# Patient Record
Sex: Female | Born: 1935 | Race: White | Hispanic: No | Smoking: Former smoker
Health system: Southern US, Community
[De-identification: ages and names within clinical notes are randomized; demographics above are authoritative.]

## PROBLEM LIST (undated history)

## (undated) DIAGNOSIS — Z48817 Encounter for surgical aftercare following surgery on the skin and subcutaneous tissue: Secondary | ICD-10-CM

## (undated) DIAGNOSIS — Z1212 Encounter for screening for malignant neoplasm of rectum: Secondary | ICD-10-CM

## (undated) DIAGNOSIS — M899 Disorder of bone, unspecified: Secondary | ICD-10-CM

## (undated) DIAGNOSIS — C159 Malignant neoplasm of esophagus, unspecified: Secondary | ICD-10-CM

## (undated) DIAGNOSIS — M949 Disorder of cartilage, unspecified: Secondary | ICD-10-CM

## (undated) DIAGNOSIS — E079 Disorder of thyroid, unspecified: Secondary | ICD-10-CM

## (undated) DIAGNOSIS — I10 Essential (primary) hypertension: Secondary | ICD-10-CM

## (undated) DIAGNOSIS — L570 Actinic keratosis: Secondary | ICD-10-CM

## (undated) DIAGNOSIS — C44319 Basal cell carcinoma of skin of other parts of face: Secondary | ICD-10-CM

## (undated) DIAGNOSIS — D485 Neoplasm of uncertain behavior of skin: Secondary | ICD-10-CM

## (undated) HISTORY — DX: Encounter for surgical aftercare following surgery on the skin and subcutaneous tissue: Z48.817

## (undated) HISTORY — DX: Disorder of thyroid, unspecified: E07.9

## (undated) HISTORY — PX: UPPER GASTROINTESTINAL ENDOSCOPY: SHX188

## (undated) HISTORY — DX: Neoplasm of uncertain behavior of skin: D48.5

## (undated) HISTORY — DX: Disorder of bone, unspecified: M89.9

## (undated) HISTORY — DX: Disorder of cartilage, unspecified: M94.9

## (undated) HISTORY — DX: Encounter for screening for malignant neoplasm of rectum: Z12.12

## (undated) HISTORY — DX: Basal cell carcinoma of skin of other parts of face: C44.319

## (undated) HISTORY — DX: Actinic keratosis: L57.0

---
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
## 1998-02-07 HISTORY — PX: OTHER SURGICAL HISTORY: SHX169

## 1998-02-07 LAB — HM COLONOSCOPY

## 1999-02-06 DIAGNOSIS — K9171 Accidental puncture and laceration of a digestive system organ or structure during a digestive system procedure: Secondary | ICD-10-CM | POA: Insufficient documentation

## 1999-02-06 DIAGNOSIS — K631 Perforation of intestine (nontraumatic): Secondary | ICD-10-CM | POA: Insufficient documentation

## 2000-01-25 ENCOUNTER — Inpatient Hospital Stay (HOSPITAL_COMMUNITY): Admission: EM | Admit: 2000-01-25 | Discharge: 2000-01-30 | Payer: Self-pay | Admitting: Internal Medicine

## 2000-01-25 ENCOUNTER — Encounter: Payer: Self-pay | Admitting: Internal Medicine

## 2001-02-07 LAB — HM PAP SMEAR

## 2003-02-08 HISTORY — PX: BACK SURGERY: SHX140

## 2007-02-08 LAB — HM MAMMOGRAPHY: HM MAMMO: NORMAL (ref 0–4)

## 2007-10-18 ENCOUNTER — Encounter: Payer: Self-pay | Admitting: Infectious Diseases

## 2007-10-24 ENCOUNTER — Ambulatory Visit: Payer: Self-pay | Admitting: Infectious Diseases

## 2007-10-24 DIAGNOSIS — R1310 Dysphagia, unspecified: Secondary | ICD-10-CM | POA: Insufficient documentation

## 2007-10-24 DIAGNOSIS — R509 Fever, unspecified: Secondary | ICD-10-CM

## 2007-10-24 LAB — CONVERTED CEMR LAB
ALT: 25 units/L (ref 0–35)
AST: 26 units/L (ref 0–37)
Albumin: 4.5 g/dL (ref 3.5–5.2)
Alkaline Phosphatase: 132 units/L — ABNORMAL HIGH (ref 39–117)
BUN: 20 mg/dL (ref 6–23)
Basophils Absolute: 0 10*3/uL (ref 0.0–0.1)
Basophils Relative: 0 % (ref 0–1)
CO2: 22 meq/L (ref 19–32)
CRP: 2.9 mg/dL — ABNORMAL HIGH (ref <0.6)
Calcium: 10.7 mg/dL — ABNORMAL HIGH (ref 8.4–10.5)
Chloride: 99 meq/L (ref 96–112)
Creatinine, Ser: 0.71 mg/dL (ref 0.40–1.20)
Eosinophils Absolute: 0.3 10*3/uL (ref 0.0–0.7)
Eosinophils Relative: 3 % (ref 0–5)
Ferritin: 316 ng/mL — ABNORMAL HIGH (ref 10–291)
Glucose, Bld: 99 mg/dL (ref 70–99)
HCT: 46.6 % — ABNORMAL HIGH (ref 36.0–46.0)
Hemoglobin: 15 g/dL (ref 12.0–15.0)
Lymphocytes Relative: 23 % (ref 12–46)
Lymphs Abs: 2.7 10*3/uL (ref 0.7–4.0)
MCHC: 32.2 g/dL (ref 30.0–36.0)
MCV: 87.1 fL (ref 78.0–100.0)
Monocytes Absolute: 1.3 10*3/uL — ABNORMAL HIGH (ref 0.1–1.0)
Monocytes Relative: 11 % (ref 3–12)
Neutro Abs: 7.6 10*3/uL (ref 1.7–7.7)
Neutrophils Relative %: 64 % (ref 43–77)
Platelets: 466 10*3/uL — ABNORMAL HIGH (ref 150–400)
Potassium: 4.3 meq/L (ref 3.5–5.3)
RBC: 5.35 M/uL — ABNORMAL HIGH (ref 3.87–5.11)
RDW: 14.1 % (ref 11.5–15.5)
Sed Rate: 43 mm/hr — ABNORMAL HIGH (ref 0–22)
Sodium: 138 meq/L (ref 135–145)
Total Bilirubin: 0.5 mg/dL (ref 0.3–1.2)
Total Protein: 7.9 g/dL (ref 6.0–8.3)
WBC: 11.9 10*3/uL — ABNORMAL HIGH (ref 4.0–10.5)

## 2007-10-30 ENCOUNTER — Ambulatory Visit: Payer: Self-pay | Admitting: Infectious Diseases

## 2007-10-30 ENCOUNTER — Encounter: Payer: Self-pay | Admitting: Infectious Diseases

## 2007-10-30 ENCOUNTER — Ambulatory Visit (HOSPITAL_COMMUNITY): Admission: RE | Admit: 2007-10-30 | Discharge: 2007-10-30 | Payer: Self-pay | Admitting: Infectious Diseases

## 2007-10-30 LAB — CONVERTED CEMR LAB
Bilirubin Urine: NEGATIVE
Hemoglobin, Urine: NEGATIVE
Ketones, ur: NEGATIVE mg/dL
Nitrite: NEGATIVE
Protein, ur: NEGATIVE mg/dL
RBC / HPF: NONE SEEN (ref <3)
Specific Gravity, Urine: 1.01 (ref 1.005–1.03)
Urine Glucose: NEGATIVE mg/dL
Urobilinogen, UA: 0.2 (ref 0.0–1.0)
pH: 7 (ref 5.0–8.0)

## 2007-10-31 ENCOUNTER — Telehealth: Payer: Self-pay | Admitting: Infectious Diseases

## 2007-11-01 ENCOUNTER — Telehealth: Payer: Self-pay | Admitting: Internal Medicine

## 2007-11-05 ENCOUNTER — Ambulatory Visit: Payer: Self-pay | Admitting: Infectious Diseases

## 2007-11-05 LAB — CONVERTED CEMR LAB
Basophils Absolute: 0 10*3/uL (ref 0.0–0.1)
Basophils Relative: 0 % (ref 0–1)
CRP: 1.4 mg/dL — ABNORMAL HIGH (ref <0.6)
Eosinophils Absolute: 0.2 10*3/uL (ref 0.0–0.7)
Eosinophils Relative: 2 % (ref 0–5)
HCT: 44.5 % (ref 36.0–46.0)
Hemoglobin: 14.3 g/dL (ref 12.0–15.0)
Lymphocytes Relative: 41 % (ref 12–46)
Lymphs Abs: 2.8 10*3/uL (ref 0.7–4.0)
MCHC: 32.1 g/dL (ref 30.0–36.0)
MCV: 86.7 fL (ref 78.0–100.0)
Monocytes Absolute: 0.5 10*3/uL (ref 0.1–1.0)
Monocytes Relative: 8 % (ref 3–12)
Neutro Abs: 3.3 10*3/uL (ref 1.7–7.7)
Neutrophils Relative %: 49 % (ref 43–77)
Platelets: 359 10*3/uL (ref 150–400)
RBC: 5.13 M/uL — ABNORMAL HIGH (ref 3.87–5.11)
RDW: 14.4 % (ref 11.5–15.5)
Sed Rate: 21 mm/hr (ref 0–22)
WBC: 6.7 10*3/uL (ref 4.0–10.5)

## 2007-11-06 ENCOUNTER — Ambulatory Visit: Payer: Self-pay | Admitting: Gastroenterology

## 2007-11-06 DIAGNOSIS — R1319 Other dysphagia: Secondary | ICD-10-CM

## 2007-11-06 DIAGNOSIS — R630 Anorexia: Secondary | ICD-10-CM

## 2007-11-07 ENCOUNTER — Encounter: Payer: Self-pay | Admitting: Infectious Diseases

## 2007-11-07 ENCOUNTER — Encounter: Payer: Self-pay | Admitting: Internal Medicine

## 2007-11-07 ENCOUNTER — Ambulatory Visit: Payer: Self-pay | Admitting: Internal Medicine

## 2007-11-08 ENCOUNTER — Encounter: Payer: Self-pay | Admitting: Internal Medicine

## 2007-11-12 ENCOUNTER — Telehealth: Payer: Self-pay | Admitting: Internal Medicine

## 2009-11-24 HISTORY — PX: OTHER SURGICAL HISTORY: SHX169

## 2010-06-25 NOTE — Op Note (Signed)
[facility]  Patient:    [name], Cheryl                  MRN: [id number] Proc. Date: 01/25/00 Adm. Date:  [id number] Attending:  Henrene Dodge CC:         Hedwig Morton. Juanda Chance, M.D. [facility]   Operative Report  PREOPERATIVE DIAGNOSIS:  Perforated colon during colonoscopy.  POSTOPERATIVE DIAGNOSIS:  Perforated colon distal sigmoid and minimal sigmoid diverticulosis.  OPERATION:  Exploratory laparotomy and closure of perforated colon and interoperative flexible sigmoidoscopy.  ANESTHESIA:  General.  SURGEON:  Anselm Pancoast. Zachery Dakins, M.D.  HISTORY OF PRESENT ILLNESS:  Cheryl Holmes is a 77 year old Caucasian female who was undergoing a colonoscopy today by Dr. Lina Sar for symptoms of basically change in bowel habits and feeling that she was not completely evacuating.  She had not had any blood in her stool and is not anemic at the time of the colonoscopy.   Dr. Juanda Chance said she just at approximately 20 cm when obviously the patient had a perforation with pain, etc., and I was called promptly.  The OR was notified and was fortunately available in a short time, and she was given 3 grams of Unasyn and permission obtained for a laparotomy for closure of the perforated colon.  I discussed with the patient that it might be necessary for a temporary colostomy, and that we would check to see if any evidence of any obvious lesion because of these changes in bowel habits.  The patient was taken over to surgery.  The 3 grams of Unasyn had been given, induction of general anesthesia, and a Foley catheter was placed sterilely, and then the abdomen was prepped with Betadine surgical scrubbing solution and draped in a sterile manner.  A midline incision fromthe umbilicus to the subpubic area was made, down through the skin and subcutaneous anterior fascia and then carefully opened to the peritoneum. There was a moderate amount of free air but fortunately  not much stool contamination.  In the distal sigmoid, there was a few adhesions that looked like maybe minimal diverticulosis around it, and these were taken down, and then probably about 3 inches above the peritoneal reflection or maybe 2, you could see a perforation anterior, and the edges of this were carefully identified, and then I closed it transversely with interrupted sutures of 3-0 silk.  Next, on examination of the colon, I could feel no lesions in the sigmoid, splenic flexure, transverse colon, or hepatic flexure, but, in the area right above the area of the sigmoid colon, I could feel this thickening in the mesentery side that really felt more like diverticulosis.  Since she had had a change in bowel movements, it was then evaluated because of a partial obstruction type minimal symptoms, I elected to talk with Dr. Juanda Chance and suggested that we do an intraoperative flexible sigmoidoscopy to make sure that flexible sigmoidoscopy to make sure that were not missing a little lesion at this area.  Dr. Yancey Flemings was available and came in, and we placed her up in frogleg position and then colonoscoped her and did a flexible sigmoidoscopy to this area.  He could see the repaired area and then about 3-4 cm past that, could see a diverticulum.  There was definitely of any mucosal lesions like a tumor.  We put the scope on up about 6 inches above that, then came back through the area again, and there was no colonic mucosal lesion.  With  this, I then completed the procedure.  We had evacuated the gas out of the colon.  I hadpinched it off proximally so that she would not be real distended, and then the small bowel was placed in normal anatomical position and the omentum brought over it, and then the midline was closed with a running #1 PDS, and the skin was closed with staples.  The patient tolerated the procedure nicely and will be receiving Unasyn for approximately 48 hours and then PCA  morphine for pain control.  I am going to attempt to do her without doing an NG tube. If she becomes nauseous or distended, we will have to place one, but hopefully this will not be necessary with the limits of surgery that was needed. Sponge, needle and instrument counts were correct.Estimated blood loss was minimal.  Dr. Marina Goodell will dictate his portion of the flexible sigmoidoscopy. DD:  01/25/16 TD:  01/27/00 Job: [id number]

## 2010-06-25 NOTE — H&P (Signed)
[facility]  Patient:    Cheryl Holmes, Cheryl Holmes                 MRN: [id number] Adm. Date:  01/25/00 Attending:  Hedwig Morton. Juanda Chance, M.D. [facility] Dictator:   Mike Gip, P.A.-C CC:         Dr. Dan Humphreys in [facility]. Zachery Dakins, M.D.   History and Physical  PROBLEM:  Pneumoperitoneum.  HISTORY OF PRESENT ILLNESS:  Cheryl Holmes is a pleasant, generally healthy, 77 year old white female primary patient of Dr. Dan Humphreys in [place] who was evaluated in our office on December 17 per Hedwig Morton. Juanda Chance, M.D. with complaints of change in her bowel habits. The patient has noted constipation onset in September of 2001. Apparently, she has been able to have a bowel movement but has had increased effort with bowel movements over the past couple of months and has noticed a change in her stool caliber with more narrow stools, as well. She had no complaints of abdominal pain or rectal bleeding. No nausea or vomiting. Appetite had been fine and weight had been stable. The patient was scheduled for outpatient colonoscopy today and underwent colonoscopy with Hedwig Morton. Juanda Chance, M.D. Shortly after insertion of the colonoscope into the rectosigmoid region at approximately 20 cm at the level of the first "turn", a pop was heard consistent with a perforation. The scope was retracted. Exam was not completed, and abdominal films were obtained. She did appear to have an excellent prep. Abdominal films are consistent with pneumoperitoneum. At this time, the patient is admitted to the hospital for surgical consultation and exploratory laparotomy. At the time of admission, the patient is complaining of a dull ache in her lower abdomen but no significant pain. She is hemodynamically stable.  CURRENT MEDICATIONS: 1. Prempro 1 p.o. q.d. 2. Synthroid 0.05 p.o. q.d. 3. Multivitamin daily. 4. Potassium on a p.r.n. basis.  ALLERGIES:  SULFA which causes rash and hives.  PAST  MEDICAL HISTORY:  Pertinent for hypothyroidism, remote appendectomy, a question of emphysema, and has been told that she has a sinus arrhythmia by her primary physician.  FAMILY HISTORY:  Negative for colon cancer or polyps. The patients father was deceased at 31 secondary to an MI. Mother deceased at 37 secondary to "old age."  SOCIAL HISTORY:  The patient is married. She is retired. She lives in [place]. Is very active. Plays tennis on a regular basis. She has four children. She is a nonsmoker. Drinks alcohol socially.  REVIEW OF SYSTEMS:  CARDIOVASCULAR:  The patient has no complaints of chest pain or anginal symptoms. PULMONARY:  The patient has had some dyspnea long-term and wheezing with exposure to dust, etc. Apparently, her tolerance level is very good, however. She states she can play tennis and walk 4 miles without difficulty. GI:  As above. GENITOURINARY:  Denies any dysuria, urgency, or frequency. MUSCULOSKELETAL:  Pertinent for history of sciatic pain in the past and no current symptoms.  PHYSICAL EXAMINATION:  Per Hedwig Morton. Juanda Chance, M.D.  GENERAL:  The patient is a well-developed, thin, white female in no acute distress. She is alert post procedure and post sedation.  VITAL SIGNS:  Blood pressure 120/70, pulse is 80 and irregular rate, respirations 16, weight is 115.  SKIN:  Warm and dry.  HEENT:  Normocephalic, atraumatic. EOMI. PERRLA. Sclerae anicteric.  NECK:  Supple. There is no JVD or bruits.  CARDIOVASCULAR:  Regular rate and rhythm with S1 and S2.  PULMONARY:  Clear to A&P.  ABDOMEN:  Soft. Mildly tympanitic. She is nontender. Bowel sounds are present but hypoactive. There is no guarding or rebound. No palpable mass or hepatosplenomegaly.  RECTAL:  Stool heme negative. See colonoscopy report.  EXTREMITIES:  Without clubbing, cyanosis, or edema. Pulses are intact.  LABORATORY DATA:  Abdominal films per Dr. ______ consistent  with pneumoperitoneum.  Labs are pending at the time of this dictation.  IMPRESSION: 1. The patient is a 77 year old white female with a pneumoperitoneum secondary    to instrumentation of the rectosigmoid colon. 2. Rule out obstructing lesion in the rectosigmoid with recent complaint of    constipation and change in stool caliber. 3. Hypothyroidism. 4. Sinus arrhythmia. 4. Status post remote appendectomy. 5. Chronic obstructive pulmonary disease by history.  PLAN:  The patient is admitted to the hospital. She will be kept n.p.o. We have obtained surgical consultation per Anselm Pancoast. Weatherly, M.D. and anticipate that she will go to the OR this afternoon. She has been given a dose of IV Cipro and will be continued on IV Unasyn. Admission labs obtained. For further details, please see the orders. DD:  01/25/00 TD:  01/25/00 Job: [id number]

## 2010-06-25 NOTE — Discharge Summary (Signed)
[facility]  Patient:    Cheryl Holmes, Cheryl Holmes                  MRN: [id number] Adm. Date:  [id number] Disc. Date: [id number] Attending:  Henrene Dodge CC:         Hedwig Morton. Juanda Chance, M.D. [facility]   Discharge Summary  DISCHARGE DIAGNOSES: 1. Perforation of colon, sigmoid, with colonoscopy. 2. History of diverticulosis.  OPERATION: 1. Laparotomy and repair of laceration perforation sigmoid colon, attempt at    full colonoscopy by Dr. Lina Sar. 2. Flexible sigmoidoscopy by Dr. Yancey Flemings.  HISTORY OF PRESENT ILLNESS:  Cheryl Holmes is a 77 year old Caucasian female who was undergoing a colonoscopy for constipation, sort of a change in bowel habits by Dr. Lina Sar on January 25, 2000, when the patient experienced discomfort.  Dr. Juanda Chance recognized that a perforation of the sigmoid colon right above the peritoneal flexure had occurred.  She stopped the colonoscopy, and general surgery consultation was requested.  I saw her, and took the patient directly to OR shortly afterwards.  She had a small laceration about 3 cm, really right at the top of the rectum, distal sigmoid, that was repaired with suture closure, and I then had a flexible sigmoidoscopy performed by Dr. Yancey Flemings to make sure that the little area that I was feeling was just diverticulosis.  There was no evidence of any obstruction of the sigmoid.  There was minimal diverticulosis.  Postoperatively, she did nicely.  We did not place a nasogastric tube, but kept her on broad antibiotic coverage.  She started passing a little flatus on the third postoperative day, and then started having bowel function shortly afterwards.  The incision healed without evidence of infection.  She required very little pain medication postoperatively, and was ready for discharge in an improved condition on January 30, 2000.  Her incisions are healing satisfactorily, and I will see her in the office for  removal of the staples in 3 to 4 days.  She denied the need of pain medications at the time of discharge.  The patient is not on any type of chronic medications, but will use laxatives if needed for regulation of her bowels which was the presenting symptom that caused her to undergo the colonoscopy. DD:  02/17/00 TD:  02/17/00 Job: [id number]

## 2015-04-27 DIAGNOSIS — Z719 Counseling, unspecified: Secondary | ICD-10-CM | POA: Diagnosis not present

## 2015-04-27 DIAGNOSIS — J019 Acute sinusitis, unspecified: Secondary | ICD-10-CM | POA: Diagnosis not present

## 2015-04-27 DIAGNOSIS — Z681 Body mass index (BMI) 19 or less, adult: Secondary | ICD-10-CM | POA: Diagnosis not present

## 2015-06-08 DIAGNOSIS — E039 Hypothyroidism, unspecified: Secondary | ICD-10-CM | POA: Diagnosis not present

## 2015-06-08 DIAGNOSIS — R5383 Other fatigue: Secondary | ICD-10-CM | POA: Diagnosis not present

## 2015-06-08 DIAGNOSIS — E875 Hyperkalemia: Secondary | ICD-10-CM | POA: Diagnosis not present

## 2015-06-08 DIAGNOSIS — M899 Disorder of bone, unspecified: Secondary | ICD-10-CM | POA: Diagnosis not present

## 2015-06-08 LAB — LIPID PANEL
CHOLESTEROL: 208 mg/dL — AB (ref 0–200)
HDL: 73 mg/dL — AB (ref 35–70)
LDL Cholesterol: 120 mg/dL
TRIGLYCERIDES: 73 mg/dL (ref 40–160)

## 2015-06-08 LAB — BASIC METABOLIC PANEL
BUN: 19 mg/dL (ref 4–21)
CREATININE: 0.9 mg/dL (ref 0.5–1.1)
Glucose: 91 mg/dL
POTASSIUM: 5.1 mmol/L (ref 3.4–5.3)
SODIUM: 139 mmol/L (ref 137–147)

## 2015-06-08 LAB — HEPATIC FUNCTION PANEL
ALK PHOS: 106 U/L (ref 25–125)
ALT: 28 U/L (ref 7–35)
AST: 26 U/L (ref 13–35)
BILIRUBIN, TOTAL: 0.3 mg/dL

## 2015-06-08 LAB — TSH: TSH: 1.97 u[IU]/mL (ref 0.41–5.90)

## 2015-06-08 LAB — VITAMIN D 25 HYDROXY (VIT D DEFICIENCY, FRACTURES): Vit D, 25-Hydroxy: 47.8

## 2015-06-08 LAB — CBC AND DIFFERENTIAL
HCT: 47 % — AB (ref 36–46)
HEMOGLOBIN: 15.2 g/dL (ref 12.0–16.0)
Platelets: 279 10*3/uL (ref 150–399)
WBC: 7.1 10*3/mL

## 2015-06-11 DIAGNOSIS — Z719 Counseling, unspecified: Secondary | ICD-10-CM | POA: Diagnosis not present

## 2015-06-11 DIAGNOSIS — Z0000 Encounter for general adult medical examination without abnormal findings: Secondary | ICD-10-CM | POA: Diagnosis not present

## 2015-06-11 DIAGNOSIS — Z681 Body mass index (BMI) 19 or less, adult: Secondary | ICD-10-CM | POA: Diagnosis not present

## 2015-06-11 DIAGNOSIS — E039 Hypothyroidism, unspecified: Secondary | ICD-10-CM | POA: Diagnosis not present

## 2015-07-14 DIAGNOSIS — H52223 Regular astigmatism, bilateral: Secondary | ICD-10-CM | POA: Diagnosis not present

## 2015-07-14 DIAGNOSIS — H353131 Nonexudative age-related macular degeneration, bilateral, early dry stage: Secondary | ICD-10-CM | POA: Diagnosis not present

## 2015-07-14 DIAGNOSIS — H16223 Keratoconjunctivitis sicca, not specified as Sjogren's, bilateral: Secondary | ICD-10-CM | POA: Diagnosis not present

## 2015-07-28 DIAGNOSIS — H16223 Keratoconjunctivitis sicca, not specified as Sjogren's, bilateral: Secondary | ICD-10-CM | POA: Diagnosis not present

## 2015-08-28 DIAGNOSIS — H16223 Keratoconjunctivitis sicca, not specified as Sjogren's, bilateral: Secondary | ICD-10-CM | POA: Diagnosis not present

## 2015-09-11 DIAGNOSIS — H5315 Visual distortions of shape and size: Secondary | ICD-10-CM | POA: Diagnosis not present

## 2015-09-11 DIAGNOSIS — H16223 Keratoconjunctivitis sicca, not specified as Sjogren's, bilateral: Secondary | ICD-10-CM | POA: Diagnosis not present

## 2015-09-11 DIAGNOSIS — H353132 Nonexudative age-related macular degeneration, bilateral, intermediate dry stage: Secondary | ICD-10-CM | POA: Diagnosis not present

## 2016-06-16 ENCOUNTER — Encounter: Payer: Self-pay | Admitting: Internal Medicine

## 2016-07-06 ENCOUNTER — Non-Acute Institutional Stay: Payer: Medicare Other | Admitting: Internal Medicine

## 2016-07-06 ENCOUNTER — Encounter: Payer: Self-pay | Admitting: Internal Medicine

## 2016-07-06 VITALS — BP 138/60 | HR 55 | Temp 97.9°F | Ht 63.0 in | Wt 105.0 lb

## 2016-07-06 DIAGNOSIS — Z8619 Personal history of other infectious and parasitic diseases: Secondary | ICD-10-CM | POA: Diagnosis not present

## 2016-07-06 DIAGNOSIS — E559 Vitamin D deficiency, unspecified: Secondary | ICD-10-CM | POA: Insufficient documentation

## 2016-07-06 DIAGNOSIS — H3530 Unspecified macular degeneration: Secondary | ICD-10-CM | POA: Insufficient documentation

## 2016-07-06 DIAGNOSIS — Z13220 Encounter for screening for lipoid disorders: Secondary | ICD-10-CM | POA: Diagnosis not present

## 2016-07-06 DIAGNOSIS — E039 Hypothyroidism, unspecified: Secondary | ICD-10-CM | POA: Insufficient documentation

## 2016-07-06 DIAGNOSIS — K9171 Accidental puncture and laceration of a digestive system organ or structure during a digestive system procedure: Secondary | ICD-10-CM

## 2016-07-06 DIAGNOSIS — K631 Perforation of intestine (nontraumatic): Secondary | ICD-10-CM | POA: Diagnosis not present

## 2016-07-06 NOTE — Progress Notes (Signed)
Provider:  Rexene Edison. Mariea Clonts, D.O., C.M.D. Location:  Occupational psychologist of Service:  Clinic (12)  Previous PCP: Gayland Curry, DO Patient Care Team: Gayland Curry, DO as PCP - General (Geriatric Medicine)  Extended Emergency Contact Information Primary Emergency Contact: Marisue Humble of [place] Phone: [phone] Work Phone: [phone] Relation: Daughter Secondary Emergency Contact: Cone,Tom Address: [address], [place] [address] [address]nette Litter of [place] Phone: [phone] Mobile Phone: [phone] Relation: Other  Code Status: DNR Goals of Care: Advanced Directive information No flowsheet data found. Living will and hcpoa [facility] were copied and will be scanned  Chief Complaint  Patient presents with  . Establish Care    New Patient    HPI: Patient is a 83 y.o. female seen today to establish with [facility].  Records have been received from   her prior PCP and reviewed.  She's been here 4 mos.    Hypothyroidism:  On levothyroxine 88mcg daily for 30 plus years.    Takes vitamin D supplement.   She is participating in the [facility] balance studies.  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  Standing on the balance balls and graduated to a higher ball today.  She also takes two exercise classes after that.  She walks 2-3 miles per day instead.      She does have some macular degeneration.  Still drives, but reading is getting iffy --has letters moving up and down and shadows.  She has not established with anyone.  Discussed ophtho on site.    She got sick as a damn dog a long time ago with a flu shot.  She tries to eat sensibly and take care of herself. She's not messing with shots.    Reports having some dysphagia with large tablets.  She eats too fast and does not chew well sometimes.  She had a bout with reflux at one point that got really serious and  she thinks she got scar tissue.  She does not have Barrett's itself but had some rogue cells by report.    She had ruptured colon with a colonoscopy 2000.  She can no longer get another cscope.    She refuses bone densities, vaccines.  Has quit getting mammograms.   She had shingles more than once--first in 1957 on the right side of her face involving the right eye.  She still gets spots occasionally on her face that minor. They go away spontaneously.  Back then, warm compresses were the only interventions.  Her husband got the vaccine and then got shingles. Does not want shingrix.    Taking L-lysine for skin, bones and hair--she had lived in [place] before this.  She notes these things improved for her and her friends.  Past Medical History:  Diagnosis Date  . Actinic keratosis   . Aftercare following surgery of the skin or subcutaneous tissue   . Basal cell carcinoma of skin of other parts of face   . Bone/cartilage disorder    nose  . Neoplasm of uncertain behavior of skin   . Screening for malignant neoplasm of the rectum   . Thyroid disease    Past Surgical History:  Procedure Laterality Date  . APPENDECTOMY  1939   Dr. Charisse March  . BACK SURGERY  2005   Dr. Micheal Likens  . Cancer Removal  Forehead, Basal Cell N/A 11/24/2009  . colonoscopy performation  2000   Dr. Maurene Capes, Dr.Weatherly    Social History   Social History  . Marital status: Widowed    Spouse name: N/A  . Number of children: 4  . Years of education: N/A   Occupational History  . retired    Social History Main Topics  . Smoking status: Former Smoker    Types: Cigarettes  . Smokeless tobacco: Never Used  . Alcohol use Yes  . Drug use: No  . Sexual activity: No   Other Topics Concern  . None   Social History Narrative   Tobacco use, amount per day now: NONE   Past tobacco use, amount per day: MINIMAL   How many years did you use tobacco: 3 OR 4    Alcohol use (drinks per week): 3 OR 4    Diet: REGULAR   Do you drink/eat things with caffeine: YES   Marital status:  WIDOWED                                What year were you married? 1951   Do you live in a house, apartment, assisted living, condo, trailer, etc.? APT   Is it one or more stories? YES   How many persons live in your home? A LOT   Do you have pets in your home?( please list) NO   Current or past profession: WORKED [employer]   Do you exercise?     YES                             Type and how often? WALKING- CLASSES   Do you have a living will? YES   Do you have a DNR form?    YES                               If not, do you want to discuss one?   Do you have signed POA/HPOA forms?  YES                      If so, please bring to you appointment       reports that she has quit smoking. Her smoking use included Cigarettes. She has never used smokeless tobacco. She reports that she drinks alcohol. She reports that she does not use drugs.  Functional Status Survey:  independent, plays bridge competitively  Family History  Problem Relation Age of Onset  . Kidney disease Mother   . Heart disease Father     Health Maintenance  Topic Date Due  . Samul Dada  05/22/1945  . DEXA SCAN  05/21/1993  . PNA vac Low Risk Adult (1 of 2 - PCV13) 05/21/1993  . MAMMOGRAM  02/08/2008  . INFLUENZA VACCINE  09/07/2016    Allergies  Allergen Reactions  . Polysporin [Bacitracin-Polymyxin B] Anaphylaxis and Rash  . Sulfonamide Derivatives Anaphylaxis and Rash    REACTION: hive    Allergies as of 07/06/2016      Reactions   Polysporin [bacitracin-polymyxin B] Anaphylaxis, Rash   Sulfonamide Derivatives Anaphylaxis, Rash   REACTION: hive      Medication List       Accurate as of 07/06/16  2:11 PM. Always use your most recent med list.  cholecalciferol 1000 units tablet Commonly known as:  VITAMIN D Take 1,000 Units by mouth daily.   L-FORMULA LYSINE HCL 500 MG Tabs Generic drug:  Lysine HCl Take 1  tablet by mouth daily.   levothyroxine 50 MCG tablet Commonly known as:  SYNTHROID, LEVOTHROID Take 50 mcg by mouth daily before breakfast.   PRESERVISION AREDS 2 Caps Take 2 capsules by mouth daily.       Review of Systems  Constitutional: Negative for chills, fever and malaise/fatigue.  HENT: Negative for congestion and hearing loss.   Eyes: Positive for blurred vision.       Has macular degeneration  Respiratory: Negative for cough and shortness of breath.   Cardiovascular: Negative for chest pain, palpitations and leg swelling.  Gastrointestinal: Negative for abdominal pain, blood in stool, constipation, heartburn, melena and nausea.       Eats almonds and follows a pharmacy column  Genitourinary: Negative for dysuria, frequency and urgency.  Musculoskeletal: Negative for back pain, falls, joint pain, myalgias and neck pain.  Skin: Negative for itching and rash.  Neurological: Negative for dizziness, loss of consciousness and weakness.  Endo/Heme/Allergies: Negative for environmental allergies. Does not bruise/bleed easily.  Psychiatric/Behavioral: Negative for depression and memory loss. The patient is not nervous/anxious and does not have insomnia.        Sometimes doesn't sleep well, but it doesn't matter, she says    Vitals:   07/06/16 1354  BP: 138/60  Pulse: (!) 55  Temp: 97.9 F (36.6 C)  TempSrc: Oral  SpO2: 95%  Weight: 105 lb (47.6 kg)  Height: 5\' 3"  (1.6 m)   Body mass index is 18.6 kg/m. Physical Exam  Constitutional: She is oriented to person, place, and time. She appears well-developed and well-nourished. No distress.  Thin white female  HENT:  Head: Normocephalic and atraumatic.  Right Ear: External ear normal.  Left Ear: External ear normal.  Nose: Nose normal.  Eyes: Conjunctivae and EOM are normal. Pupils are equal, round, and reactive to light.  Cardiovascular: Normal rate, regular rhythm, normal heart sounds and intact distal pulses.     Pulmonary/Chest: Effort normal and breath sounds normal. No respiratory distress.  Abdominal: Soft. Bowel sounds are normal.  Musculoskeletal: Normal range of motion.  Walks steadily w/o assistive device  Neurological: She is alert and oriented to person, place, and time.  Skin: Skin is warm and dry. Capillary refill takes less than 2 seconds.  Scar from basal cell ca removal on forehead  Psychiatric: She has a normal mood and affect. Her behavior is normal. Judgment and thought content normal.    Labs reviewed: Basic Metabolic Panel: No results for input(s): NA, K, CL, CO2, GLUCOSE, BUN, CREATININE, CALCIUM, MG, PHOS in the last 8760 hours. Liver Function Tests: No results for input(s): AST, ALT, ALKPHOS, BILITOT, PROT, ALBUMIN in the last 8760 hours. No results for input(s): LIPASE, AMYLASE in the last 8760 hours. No results for input(s): AMMONIA in the last 8760 hours. CBC: No results for input(s): WBC, NEUTROABS, HGB, HCT, MCV, PLT in the last 8760 hours. Cardiac Enzymes: No results for input(s): CKTOTAL, CKMB, CKMBINDEX, TROPONINI in the last 8760 hours. BNP: Invalid input(s): POCBNP No results found for: HGBA1C Lab Results  Component Value Date   TSH 1.97 06/08/2015   No results found for: VITAMINB12 No results found for: FOLATE Lab Results  Component Value Date   FERRITIN 316 (H) 10/24/2007    Imaging and Procedures noted on new patient packet: Reviewed and  items have been abstracted into appropriate sections of chart  Assessment/Plan 1. Hypothyroidism, unspecified type -has been stable for years, f/u tsh next week as not done for a year and cont current levothyroxine pending those results  2. Screening, lipid -flp next Tuesday, eats a healthy diet and exercises regularly  3. Vitamin D deficiency -cont vitamin D supplement and monitor  4. Perforation of colon as colonoscopy complication ([place]) -in 4010, no longer gets cscopes (past age anyway)  68. Macular  degeneration of both eyes, unspecified type -noted, recommended she see Dr. Ellie Lunch here at [facility] which she plans to do, cont areds vitamins  6. History of shingles -involved right eye dermatome, has some residual symptoms of lesions here and there, she says (no PHN pain though)  Labs/tests ordered:  Cbc with diff, cmp, flp, tsh next Tuesday  F/u in 1 year for AWV  Melis Trochez L. Waqas Bruhl, D.O. [facility] [address], [place] [address] Cell Phone (Mon-Fri 8am-5pm):  [phone] On Call:  [phone] & follow prompts after 5pm & weekends Office Phone:  [phone] Office Fax:  [phone]

## 2016-07-12 DIAGNOSIS — E785 Hyperlipidemia, unspecified: Secondary | ICD-10-CM | POA: Diagnosis not present

## 2016-07-12 DIAGNOSIS — D649 Anemia, unspecified: Secondary | ICD-10-CM | POA: Diagnosis not present

## 2016-07-12 DIAGNOSIS — H3530 Unspecified macular degeneration: Secondary | ICD-10-CM | POA: Diagnosis not present

## 2016-07-12 DIAGNOSIS — E559 Vitamin D deficiency, unspecified: Secondary | ICD-10-CM | POA: Diagnosis not present

## 2016-07-12 DIAGNOSIS — E039 Hypothyroidism, unspecified: Secondary | ICD-10-CM | POA: Diagnosis not present

## 2016-07-12 DIAGNOSIS — K631 Perforation of intestine (nontraumatic): Secondary | ICD-10-CM | POA: Diagnosis not present

## 2016-07-12 LAB — BASIC METABOLIC PANEL
BUN: 14 (ref 4–21)
Creatinine: 0.9 (ref 0.5–1.1)
Glucose: 97
Potassium: 4.5 (ref 3.4–5.3)
Sodium: 142 (ref 137–147)

## 2016-07-12 LAB — CBC AND DIFFERENTIAL
HCT: 49 — AB (ref 36–46)
Hemoglobin: 15.6 (ref 12.0–16.0)
Platelets: 261 (ref 150–399)
WBC: 7.2

## 2016-07-12 LAB — LIPID PANEL
Cholesterol: 214 — AB (ref 0–200)
HDL: 88 — AB (ref 35–70)
LDL Cholesterol: 99
Triglycerides: 133 (ref 40–160)

## 2016-07-12 LAB — HEPATIC FUNCTION PANEL
ALT: 16 (ref 7–35)
AST: 22 (ref 13–35)
Alkaline Phosphatase: 83 (ref 25–125)
Bilirubin, Total: 0.3

## 2016-07-12 LAB — TSH: TSH: 3.15 (ref 0.41–5.90)

## 2016-07-14 ENCOUNTER — Encounter: Payer: Self-pay | Admitting: Internal Medicine

## 2016-07-18 ENCOUNTER — Other Ambulatory Visit: Payer: Self-pay | Admitting: *Deleted

## 2016-07-18 MED ORDER — LEVOTHYROXINE SODIUM 50 MCG PO TABS: 50.0000 ug | ORAL_TABLET | Freq: Every day | ORAL | 0 refills | Status: DC

## 2016-07-18 NOTE — Telephone Encounter (Signed)
[facility] 

## 2016-08-02 DIAGNOSIS — H43391 Other vitreous opacities, right eye: Secondary | ICD-10-CM | POA: Diagnosis not present

## 2016-08-02 DIAGNOSIS — H43813 Vitreous degeneration, bilateral: Secondary | ICD-10-CM | POA: Diagnosis not present

## 2016-08-02 DIAGNOSIS — H353133 Nonexudative age-related macular degeneration, bilateral, advanced atrophic without subfoveal involvement: Secondary | ICD-10-CM | POA: Diagnosis not present

## 2016-08-15 ENCOUNTER — Encounter: Payer: Self-pay | Admitting: Internal Medicine

## 2016-08-15 DIAGNOSIS — H43813 Vitreous degeneration, bilateral: Secondary | ICD-10-CM | POA: Insufficient documentation

## 2016-09-21 ENCOUNTER — Non-Acute Institutional Stay: Payer: Medicare Other | Admitting: Internal Medicine

## 2016-09-21 ENCOUNTER — Ambulatory Visit
Admission: RE | Admit: 2016-09-21 | Discharge: 2016-09-21 | Disposition: A | Discharge status: Home / Self Care | Payer: Medicare Other | Source: Ambulatory Visit | Attending: Internal Medicine | Admitting: Internal Medicine

## 2016-09-21 ENCOUNTER — Encounter: Payer: Self-pay | Admitting: Internal Medicine

## 2016-09-21 VITALS — BP 100/70 | HR 57 | Temp 97.9°F | Wt 107.0 lb

## 2016-09-21 DIAGNOSIS — M79672 Pain in left foot: Secondary | ICD-10-CM | POA: Diagnosis not present

## 2016-09-21 NOTE — Progress Notes (Signed)
Location:  [facility]   Place of Service:  Clinic (12)  Provider: Chrystel Barefield L. Mariea Clonts, D.O., C.M.D.  Code Status: DNR Goals of Care:  Advanced Directives 09/21/2016  Does Patient Have a Medical Advance Directive? Yes  Type of Paramedic of [place];Living will;Out of facility DNR (pink MOST or yellow form)  Copy of [place] in Chart? Yes  Pre-existing out of facility DNR order (yellow form or pink MOST form) Yellow form placed in chart (order not valid for inpatient use)   Chief Complaint  Patient presents with  . Acute Visit    left foot pain x71mth    HPI: Patient is a 83 y.o. female seen today for an acute visit for left foot pain and mild swelling for about 6 weeks she tells me.  She was doing back to back exercise classes, walking 2 miles a day and had volunteered for the [employer] [facility] [employer] [facility] balance class.  The pain is on her lateral side of her left foot over the bone and in her heel.  It is not tender to touch, but throbs over the lateral aspect with it propped up and hurts when she walks on it.  She is clearly favoring that foot.  She has not been doing her exercise routine and has gained 5 lbs.  She's very upset about this and wants to get back to her routine.  Her orthopedics appt with Dr. Nona Dell PA is not until next month and she does not want to have to wait that long.  She does not take pills, but has tried elevating, compressing (made it more painful), and applying ice which helps some.  She is willing to temporarily take some nsaids if she has to for pain relief.  She does not recall any specific injury.   She has not fallen.  Past Medical History:  Diagnosis Date  . Actinic keratosis   . Aftercare following surgery of the skin or subcutaneous tissue   . Basal cell carcinoma of skin of other parts of face   . Bone/cartilage disorder    nose  . Neoplasm of uncertain behavior of skin   . Screening for malignant neoplasm of the rectum   . Thyroid  disease     Past Surgical History:  Procedure Laterality Date  . APPENDECTOMY  1939   Dr. Charisse March  . BACK SURGERY  2005   Dr. Micheal Likens  . Cancer Removal Forehead, Basal Cell N/A 11/24/2009  . colonoscopy performation  2000   Dr. Maurene Capes, Dr.Weatherly    Allergies  Allergen Reactions  . Polysporin [Bacitracin-Polymyxin B] Anaphylaxis and Rash  . Sulfonamide Derivatives Anaphylaxis and Rash    REACTION: hive    Allergies as of 09/21/2016      Reactions   Polysporin [bacitracin-polymyxin B] Anaphylaxis, Rash   Sulfonamide Derivatives Anaphylaxis, Rash   REACTION: hive      Medication List       Accurate as of 09/21/16 10:14 AM. Always use your most recent med list.          cholecalciferol 1000 units tablet Commonly known as:  VITAMIN D Take 1,000 Units by mouth daily.   L-FORMULA LYSINE HCL 500 MG Tabs Generic drug:  Lysine HCl Take 1 tablet by mouth daily.   levothyroxine 50 MCG tablet Commonly known as:  SYNTHROID, LEVOTHROID Take 1 tablet (50 mcg total) by mouth daily before breakfast.   PRESERVISION AREDS 2 Caps Take 2 capsules by mouth daily.  Review of Systems:  Review of Systems  Constitutional: Negative for chills and fever.  Respiratory: Negative for shortness of breath.   Cardiovascular: Negative for chest pain and palpitations.  Gastrointestinal: Negative for abdominal pain.  Genitourinary: Negative for dysuria.  Musculoskeletal: Negative for falls and myalgias.       Left foot and heel pain  Skin: Negative for itching and rash.  Neurological: Negative for dizziness.  Endo/Heme/Allergies: Does not bruise/bleed easily.  Psychiatric/Behavioral: Negative for depression and memory loss.       Says she'll go nuts if she can't walk soon!    Health Maintenance  Topic Date Due  . TETANUS/TDAP  05/22/1945  . DEXA SCAN  05/21/1993  . PNA vac Low Risk Adult (1 of 2 - PCV13) 05/21/1993  . MAMMOGRAM  02/08/2008  . INFLUENZA VACCINE   09/07/2016    Physical Exam: Vitals:   09/21/16 0947  BP: 100/70  Pulse: (!) 57  Temp: 97.9 F (36.6 C)  TempSrc: Oral  SpO2: 98%  Weight: 107 lb (48.5 kg)   Body mass index is 18.95 kg/m. Physical Exam  Constitutional: She appears well-developed and well-nourished. No distress.  Cardiovascular: Normal heart sounds.   Pulmonary/Chest: Breath sounds normal.  Musculoskeletal:  No tenderness of left foot, but notes pain over navicular bone and heel of foot, does sometimes go up lateral tendons in leg  Skin: Skin is warm and dry.  No ecchymoses or erythema of left foot  Psychiatric: She has a normal mood and affect.    Labs reviewed: Basic Metabolic Panel:  Recent Labs  07/12/16 07/12/16 0700  NA 142  --   K 4.5  --   BUN 14  --   CREATININE 0.9  --   TSH  --  3.15   Liver Function Tests:  Recent Labs  07/12/16 0700  AST 22  ALT 16  ALKPHOS 83   No results for input(s): LIPASE, AMYLASE in the last 8760 hours. No results for input(s): AMMONIA in the last 8760 hours. CBC:  Recent Labs  07/12/16 0700  WBC 7.2  HGB 15.6  HCT 49*  PLT 261   Lipid Panel:  Recent Labs  07/12/16 0700  CHOL 214*  HDL 88*  LDLCALC 99  TRIG 133   No results found for: HGBA1C  Procedures since last visit: No results found.  Assessment/Plan 1. Acute pain of left foot -r/o stress fracture -suspect tendinitis vs plantar fasciitis and bone spur -obtain left foot xrays to r/o the fracture  -cont rest, ice and elevate -will call her with results and next steps  Labs/tests ordered:  Orders Placed This Encounter  Procedures  . DG Foot Complete Left    Standing Status:   Future    Standing Expiration Date:   11/21/2017    Order Specific Question:   Reason for Exam (SYMPTOM  OR DIAGNOSIS REQUIRED)    Answer:   left lateral foot and heel pain    Order Specific Question:   Preferred imaging location?    Answer:   [id number]    Order Specific Question:    Radiology Contrast Protocol - do NOT remove file path    Answer:   [email or web address]    Next appt:  Keep regular appt  Farra Nikolic L. Natash Berman, D.O. [facility] [address] [place], [place] [address] Cell Phone (Mon-Fri 8am-5pm):  [phone] On Call:  [phone] & follow prompts after 5pm & weekends Office Phone:  [phone]  Office Fax:  [phone]

## 2016-10-18 ENCOUNTER — Other Ambulatory Visit: Payer: Self-pay | Admitting: Internal Medicine

## 2016-10-20 DIAGNOSIS — M7672 Peroneal tendinitis, left leg: Secondary | ICD-10-CM | POA: Diagnosis not present

## 2016-11-04 DIAGNOSIS — M7672 Peroneal tendinitis, left leg: Secondary | ICD-10-CM | POA: Diagnosis not present

## 2016-11-04 DIAGNOSIS — R2689 Other abnormalities of gait and mobility: Secondary | ICD-10-CM | POA: Diagnosis not present

## 2016-11-04 DIAGNOSIS — M79672 Pain in left foot: Secondary | ICD-10-CM | POA: Diagnosis not present

## 2016-11-08 DIAGNOSIS — M7672 Peroneal tendinitis, left leg: Secondary | ICD-10-CM | POA: Diagnosis not present

## 2016-11-08 DIAGNOSIS — R2689 Other abnormalities of gait and mobility: Secondary | ICD-10-CM | POA: Diagnosis not present

## 2016-11-08 DIAGNOSIS — M79672 Pain in left foot: Secondary | ICD-10-CM | POA: Diagnosis not present

## 2016-11-10 DIAGNOSIS — M7672 Peroneal tendinitis, left leg: Secondary | ICD-10-CM | POA: Diagnosis not present

## 2016-11-10 DIAGNOSIS — R2689 Other abnormalities of gait and mobility: Secondary | ICD-10-CM | POA: Diagnosis not present

## 2016-11-10 DIAGNOSIS — M79672 Pain in left foot: Secondary | ICD-10-CM | POA: Diagnosis not present

## 2016-11-14 DIAGNOSIS — M7672 Peroneal tendinitis, left leg: Secondary | ICD-10-CM | POA: Diagnosis not present

## 2016-11-14 DIAGNOSIS — R2689 Other abnormalities of gait and mobility: Secondary | ICD-10-CM | POA: Diagnosis not present

## 2016-11-14 DIAGNOSIS — M79672 Pain in left foot: Secondary | ICD-10-CM | POA: Diagnosis not present

## 2016-11-17 DIAGNOSIS — R2689 Other abnormalities of gait and mobility: Secondary | ICD-10-CM | POA: Diagnosis not present

## 2016-11-17 DIAGNOSIS — M79672 Pain in left foot: Secondary | ICD-10-CM | POA: Diagnosis not present

## 2016-11-17 DIAGNOSIS — M7672 Peroneal tendinitis, left leg: Secondary | ICD-10-CM | POA: Diagnosis not present

## 2016-11-21 DIAGNOSIS — M79672 Pain in left foot: Secondary | ICD-10-CM | POA: Diagnosis not present

## 2016-11-21 DIAGNOSIS — R2689 Other abnormalities of gait and mobility: Secondary | ICD-10-CM | POA: Diagnosis not present

## 2016-11-21 DIAGNOSIS — M7672 Peroneal tendinitis, left leg: Secondary | ICD-10-CM | POA: Diagnosis not present

## 2016-11-25 DIAGNOSIS — R2689 Other abnormalities of gait and mobility: Secondary | ICD-10-CM | POA: Diagnosis not present

## 2016-11-25 DIAGNOSIS — M7672 Peroneal tendinitis, left leg: Secondary | ICD-10-CM | POA: Diagnosis not present

## 2016-11-25 DIAGNOSIS — M79672 Pain in left foot: Secondary | ICD-10-CM | POA: Diagnosis not present

## 2016-11-25 DIAGNOSIS — H353131 Nonexudative age-related macular degeneration, bilateral, early dry stage: Secondary | ICD-10-CM | POA: Diagnosis not present

## 2016-11-25 DIAGNOSIS — H5213 Myopia, bilateral: Secondary | ICD-10-CM | POA: Diagnosis not present

## 2016-11-25 DIAGNOSIS — Z961 Presence of intraocular lens: Secondary | ICD-10-CM | POA: Diagnosis not present

## 2016-11-28 DIAGNOSIS — M7672 Peroneal tendinitis, left leg: Secondary | ICD-10-CM | POA: Diagnosis not present

## 2016-11-28 DIAGNOSIS — R2689 Other abnormalities of gait and mobility: Secondary | ICD-10-CM | POA: Diagnosis not present

## 2016-11-28 DIAGNOSIS — M79672 Pain in left foot: Secondary | ICD-10-CM | POA: Diagnosis not present

## 2016-12-01 DIAGNOSIS — R2689 Other abnormalities of gait and mobility: Secondary | ICD-10-CM | POA: Diagnosis not present

## 2016-12-01 DIAGNOSIS — M79672 Pain in left foot: Secondary | ICD-10-CM | POA: Diagnosis not present

## 2016-12-01 DIAGNOSIS — M7672 Peroneal tendinitis, left leg: Secondary | ICD-10-CM | POA: Diagnosis not present

## 2016-12-04 DIAGNOSIS — Z23 Encounter for immunization: Secondary | ICD-10-CM | POA: Diagnosis not present

## 2016-12-05 DIAGNOSIS — M7672 Peroneal tendinitis, left leg: Secondary | ICD-10-CM | POA: Diagnosis not present

## 2016-12-05 DIAGNOSIS — R2689 Other abnormalities of gait and mobility: Secondary | ICD-10-CM | POA: Diagnosis not present

## 2016-12-05 DIAGNOSIS — M79672 Pain in left foot: Secondary | ICD-10-CM | POA: Diagnosis not present

## 2016-12-08 DIAGNOSIS — R2689 Other abnormalities of gait and mobility: Secondary | ICD-10-CM | POA: Diagnosis not present

## 2016-12-08 DIAGNOSIS — M79672 Pain in left foot: Secondary | ICD-10-CM | POA: Diagnosis not present

## 2016-12-08 DIAGNOSIS — M7672 Peroneal tendinitis, left leg: Secondary | ICD-10-CM | POA: Diagnosis not present

## 2016-12-12 DIAGNOSIS — M7672 Peroneal tendinitis, left leg: Secondary | ICD-10-CM | POA: Diagnosis not present

## 2016-12-12 DIAGNOSIS — R2689 Other abnormalities of gait and mobility: Secondary | ICD-10-CM | POA: Diagnosis not present

## 2016-12-12 DIAGNOSIS — M79672 Pain in left foot: Secondary | ICD-10-CM | POA: Diagnosis not present

## 2016-12-14 DIAGNOSIS — M7672 Peroneal tendinitis, left leg: Secondary | ICD-10-CM | POA: Diagnosis not present

## 2016-12-14 DIAGNOSIS — M79672 Pain in left foot: Secondary | ICD-10-CM | POA: Diagnosis not present

## 2016-12-14 DIAGNOSIS — R2689 Other abnormalities of gait and mobility: Secondary | ICD-10-CM | POA: Diagnosis not present

## 2017-01-13 ENCOUNTER — Other Ambulatory Visit: Payer: Self-pay | Admitting: *Deleted

## 2017-01-13 ENCOUNTER — Other Ambulatory Visit: Payer: Self-pay | Admitting: Internal Medicine

## 2017-01-26 DIAGNOSIS — D0430 Carcinoma in situ of skin of unspecified part of face: Secondary | ICD-10-CM | POA: Diagnosis not present

## 2017-01-26 DIAGNOSIS — L570 Actinic keratosis: Secondary | ICD-10-CM | POA: Diagnosis not present

## 2017-01-26 DIAGNOSIS — D485 Neoplasm of uncertain behavior of skin: Secondary | ICD-10-CM | POA: Diagnosis not present

## 2017-01-26 DIAGNOSIS — L821 Other seborrheic keratosis: Secondary | ICD-10-CM | POA: Diagnosis not present

## 2017-01-26 DIAGNOSIS — Z85828 Personal history of other malignant neoplasm of skin: Secondary | ICD-10-CM | POA: Diagnosis not present

## 2017-01-26 DIAGNOSIS — D0439 Carcinoma in situ of skin of other parts of face: Secondary | ICD-10-CM | POA: Diagnosis not present

## 2017-01-26 DIAGNOSIS — L720 Epidermal cyst: Secondary | ICD-10-CM | POA: Diagnosis not present

## 2017-02-09 DIAGNOSIS — L821 Other seborrheic keratosis: Secondary | ICD-10-CM | POA: Diagnosis not present

## 2017-02-09 DIAGNOSIS — D0439 Carcinoma in situ of skin of other parts of face: Secondary | ICD-10-CM | POA: Diagnosis not present

## 2017-02-14 DIAGNOSIS — H353133 Nonexudative age-related macular degeneration, bilateral, advanced atrophic without subfoveal involvement: Secondary | ICD-10-CM | POA: Diagnosis not present

## 2017-02-14 DIAGNOSIS — H43393 Other vitreous opacities, bilateral: Secondary | ICD-10-CM | POA: Diagnosis not present

## 2017-02-14 DIAGNOSIS — H43813 Vitreous degeneration, bilateral: Secondary | ICD-10-CM | POA: Diagnosis not present

## 2017-06-07 DIAGNOSIS — L738 Other specified follicular disorders: Secondary | ICD-10-CM | POA: Diagnosis not present

## 2017-06-07 DIAGNOSIS — Z85828 Personal history of other malignant neoplasm of skin: Secondary | ICD-10-CM | POA: Diagnosis not present

## 2017-07-05 ENCOUNTER — Encounter: Payer: Self-pay | Admitting: Internal Medicine

## 2017-07-05 ENCOUNTER — Non-Acute Institutional Stay: Payer: Medicare Other | Admitting: Internal Medicine

## 2017-07-05 VITALS — BP 130/62 | HR 50 | Temp 98.4°F | Ht 63.0 in | Wt 109.0 lb

## 2017-07-05 DIAGNOSIS — E559 Vitamin D deficiency, unspecified: Secondary | ICD-10-CM | POA: Diagnosis not present

## 2017-07-05 DIAGNOSIS — D692 Other nonthrombocytopenic purpura: Secondary | ICD-10-CM

## 2017-07-05 DIAGNOSIS — R238 Other skin changes: Secondary | ICD-10-CM

## 2017-07-05 DIAGNOSIS — Z0000 Encounter for general adult medical examination without abnormal findings: Secondary | ICD-10-CM | POA: Diagnosis not present

## 2017-07-05 DIAGNOSIS — E039 Hypothyroidism, unspecified: Secondary | ICD-10-CM

## 2017-07-05 DIAGNOSIS — Z13220 Encounter for screening for lipoid disorders: Secondary | ICD-10-CM | POA: Diagnosis not present

## 2017-07-05 DIAGNOSIS — H353130 Nonexudative age-related macular degeneration, bilateral, stage unspecified: Secondary | ICD-10-CM | POA: Diagnosis not present

## 2017-07-05 NOTE — Progress Notes (Signed)
Provider:  Rexene Edison. Mariea Clonts, D.O., C.M.D. Location:  Occupational psychologist of Service:  Clinic (12)  Previous PCP: Gayland Curry, DO Patient Care Team: Gayland Curry, DO as PCP - General (Geriatric Medicine)  Extended Emergency Contact Information Primary Emergency Contact: Marisue Humble of [place] Phone: [phone] Work Phone: [phone] Relation: Daughter Secondary Emergency Contact: Cone,Tom Address: [place], [place] [address] Johnnette Litter of [place] Phone: [phone] Mobile Phone: [phone] Relation: Other  Code Status: DNR Goals of Care: Advanced Directive information Advanced Directives 07/05/2017  Does Patient Have a Medical Advance Directive? Yes  Type of Paramedic of [place];Living will;Out of facility DNR (pink MOST or yellow form)  Does patient want to make changes to medical advance directive? No - Patient declined  Copy of [place] in Chart? Yes  Pre-existing out of facility DNR order (yellow form or pink MOST form) Yellow form placed in chart (order not valid for inpatient use)   Chief Complaint  Patient presents with  . Annual Exam    CPE  . MMSE    29/30 passed clock    HPI: Patient is a 84 y.o. female seen today for an annual physical exam and AWV.  Refuses vaccinations.   Had ruptured colon so refuses cscopes, also refuses bone densities and done with mammograms.  Does not want shingrix even though she had shingles before. Says she takes good care of herself.  Says she is getting old and father time is catching.  She has a place on her left posterior leg beneath the calf that is hard and sore.  She forgot to ask Dr. Ubaldo Glassing about it when she saw her.  Did have a basal cell removed from her left cheek/temple.    Is still bothered by her "weight" but she says she is gaining.  Says it's the booze and the ice cream.    She still has no  other pain.  Her foot did get better, but it's stiff in the mornings.  She needs to continue the exercises she was given.  Goes to two exercise classes three days a week--chairfit 2 and 3 and had been walking 2-3 miles except if really hot.      Nursing Home from 07/05/2017 in [place]  PHQ-2 Total Score  0     Fall Risk  07/05/2017 07/05/2017 09/21/2016 07/06/2016  Falls in the past year? No No No No   FRAIL-NH Score Fatigue: 0 Resistance: 0 Ambulation : 0 Incontinence : 0 Loss of Weight: 0 Nutritional Approach : 0 Help with Dressing: 0  Total: 0 (07/05/2017  9:55 AM)   MMSE - Mini Mental State Exam 07/05/2017  Orientation to time 4  Orientation to Place 5  Registration 3  Attention/ Calculation 5  Recall 3  Language- name 2 objects 2  Language- repeat 1  Language- follow 3 step command 3  Language- read & follow direction 1  Write a sentence 1  Copy design 1  Total score 29  passed clock.  Missed the date.    Past Medical History:  Diagnosis Date  . Actinic keratosis   . Aftercare following surgery of the skin or subcutaneous tissue   . Basal cell carcinoma of skin of other parts of face   . Bone/cartilage disorder    nose  . Neoplasm of uncertain behavior of skin   .  Screening for malignant neoplasm of the rectum   . Thyroid disease    Past Surgical History:  Procedure Laterality Date  . APPENDECTOMY  1939   Dr. Charisse March  . BACK SURGERY  2005   Dr. Micheal Likens  . Cancer Removal Forehead, Basal Cell N/A 11/24/2009  . colonoscopy performation  2000   Dr. Maurene Capes, Dr.Weatherly    reports that she has quit smoking. Her smoking use included cigarettes. She has never used smokeless tobacco. She reports that she drinks alcohol. She reports that she does not use drugs.  Functional Status Survey: Is the patient deaf or have difficulty hearing?: No Does the patient have difficulty seeing, even when wearing glasses/contacts?: Yes Does the patient have  difficulty concentrating, remembering, or making decisions?: No Does the patient have difficulty walking or climbing stairs?: No Does the patient have difficulty dressing or bathing?: No Does the patient have difficulty doing errands alone such as visiting a doctor's office or shopping?: No  Family History  Problem Relation Age of Onset  . Kidney disease Mother   . Heart disease Father     Health Maintenance  Topic Date Due  . Samul Dada  05/22/1945  . DEXA SCAN  05/21/1993  . INFLUENZA VACCINE  09/07/2017  . PNA vac Low Risk Adult (2 of 2 - PPSV23) 12/04/2017    Allergies  Allergen Reactions  . Polysporin [Bacitracin-Polymyxin B] Anaphylaxis and Rash  . Sulfonamide Derivatives Anaphylaxis and Rash    REACTION: hive    Outpatient Encounter Medications as of 07/05/2017  Medication Sig  . cholecalciferol (VITAMIN D) 1000 units tablet Take 1,000 Units by mouth daily.  Marland Kitchen levothyroxine (SYNTHROID, LEVOTHROID) 50 MCG tablet TAKE 1 TABLET BY MOUTH ONCE DAILY BEFORE BREAKFAST  . Lysine HCl (L-FORMULA LYSINE HCL) 500 MG TABS Take 1 tablet by mouth daily.  . Multiple Vitamins-Minerals (PRESERVISION AREDS 2) CAPS Take 2 capsules by mouth daily.   No facility-administered encounter medications on file as of 07/05/2017.     Review of Systems  Constitutional: Negative for chills, fever and malaise/fatigue.       Gained 2 lbs  HENT: Negative for congestion and hearing loss.   Eyes: Positive for blurred vision.       Macular degeneration  Respiratory: Negative for cough and shortness of breath.   Cardiovascular: Negative for chest pain, palpitations and leg swelling.  Gastrointestinal: Negative for abdominal pain, blood in stool, constipation, diarrhea and melena.  Genitourinary: Negative for dysuria, frequency and urgency.  Musculoskeletal: Negative for falls and joint pain.  Skin: Negative for itching and rash.       Papule on left posterior distal calf  Neurological: Negative for  dizziness and loss of consciousness.  Endo/Heme/Allergies: Bruises/bleeds easily.  Psychiatric/Behavioral: Negative for depression and memory loss. The patient is not nervous/anxious and does not have insomnia.     Vitals:   07/05/17 0900  BP: 130/62  Pulse: (!) 50  Temp: 98.4 F (36.9 C)  TempSrc: Oral  SpO2: 99%  Weight: 109 lb (49.4 kg)  Height: 5\' 3"  (1.6 m)   Body mass index is 19.31 kg/m. Physical Exam  Constitutional: She is oriented to person, place, and time. No distress.  Thin female  HENT:  Head: Normocephalic and atraumatic.  Right Ear: External ear normal.  Left Ear: External ear normal.  Nose: Nose normal.  Mouth/Throat: Oropharynx is clear and moist.  Eyes: Pupils are equal, round, and reactive to light. Conjunctivae and EOM are normal.  Neck:  Normal range of motion. Neck supple. No JVD present. No tracheal deviation present. No thyromegaly present.  Cardiovascular: Normal rate, regular rhythm, normal heart sounds and intact distal pulses.  Pulmonary/Chest: Effort normal and breath sounds normal. No respiratory distress.  Abdominal: Soft. Bowel sounds are normal. She exhibits no distension. There is no tenderness.  Musculoskeletal: Normal range of motion. She exhibits no edema, tenderness or deformity.  Lymphadenopathy:    She has no cervical adenopathy.  Neurological: She is alert and oriented to person, place, and time. She displays normal reflexes. No cranial nerve deficit or sensory deficit. She exhibits normal muscle tone.  Skin: Skin is warm and dry. Capillary refill takes less than 2 seconds.  Senile purpura present on anterior shins; left distal calf region with scaly,hard, tender papule  Psychiatric: She has a normal mood and affect. Her behavior is normal. Judgment and thought content normal.    Labs reviewed: Basic Metabolic Panel: Recent Labs    07/12/16  NA 142  K 4.5  BUN 14  CREATININE 0.9   Liver Function Tests: Recent Labs     07/12/16 0700  AST 22  ALT 16  ALKPHOS 83   No results for input(s): LIPASE, AMYLASE in the last 8760 hours. No results for input(s): AMMONIA in the last 8760 hours. CBC: Recent Labs    07/12/16 0700  WBC 7.2  HGB 15.6  HCT 49*  PLT 261   Cardiac Enzymes: No results for input(s): CKTOTAL, CKMB, CKMBINDEX, TROPONINI in the last 8760 hours. BNP: Invalid input(s): POCBNP No results found for: HGBA1C Lab Results  Component Value Date   TSH 3.15 07/12/2016   No results found for: VITAMINB12 No results found for: FOLATE Lab Results  Component Value Date   FERRITIN 316 (H) 10/24/2007    Assessment/Plan  2. Hypothyroidism, unspecified type -cont levothyroxine, is for labs next week   3. Screening, lipid -cont to exercise regularly and follow healthy diet outside of her ice cream which sounds like it's a special treat  4. Vitamin D deficiency -continue vitamin D supplement, walking, chairfit  5. Bilateral nonexudative age-related macular degeneration, unspecified stage -progressive, sees Dr. Prudencio Burly for this, is dry version and uses drops and supplements  6. Excoriated papule -suspicious for cancer, she wants to put a topical supplement on it first before returning to Dr. Ubaldo Glassing about it which I suggested if she does not see improvement  7. Senile purpura -noted on lower legs, due to age, pt had asked about them today  8.  AWV performed today, see flowsheets in encounter -performed today, pt doing great -refuses all health maintenance recommendations reporting that she takes good care of herself--does exercise and eats healthily outside of her alcohol intake and her ice cream  Labs/tests ordered:  Has labs next week that I ordered last year  Nicolaas Savo L. Herve Haug, D.O. [facility] [address], [place] [address] Cell Phone (Mon-Fri 8am-5pm):  [phone] On Call:  [phone] & follow prompts after 5pm &  weekends Office Phone:  [phone] Office Fax:  [phone]

## 2017-07-19 DIAGNOSIS — C44722 Squamous cell carcinoma of skin of right lower limb, including hip: Secondary | ICD-10-CM | POA: Diagnosis not present

## 2017-07-19 DIAGNOSIS — D485 Neoplasm of uncertain behavior of skin: Secondary | ICD-10-CM | POA: Diagnosis not present

## 2017-11-17 DIAGNOSIS — Z23 Encounter for immunization: Secondary | ICD-10-CM | POA: Diagnosis not present

## 2017-11-27 DIAGNOSIS — H5213 Myopia, bilateral: Secondary | ICD-10-CM | POA: Diagnosis not present

## 2017-11-27 DIAGNOSIS — Z961 Presence of intraocular lens: Secondary | ICD-10-CM | POA: Diagnosis not present

## 2017-11-27 DIAGNOSIS — H353131 Nonexudative age-related macular degeneration, bilateral, early dry stage: Secondary | ICD-10-CM | POA: Diagnosis not present

## 2017-12-04 DIAGNOSIS — Z23 Encounter for immunization: Secondary | ICD-10-CM | POA: Diagnosis not present

## 2018-01-10 ENCOUNTER — Other Ambulatory Visit: Payer: Self-pay | Admitting: Internal Medicine

## 2018-01-19 DIAGNOSIS — L821 Other seborrheic keratosis: Secondary | ICD-10-CM | POA: Diagnosis not present

## 2018-03-05 DIAGNOSIS — H43393 Other vitreous opacities, bilateral: Secondary | ICD-10-CM | POA: Diagnosis not present

## 2018-03-05 DIAGNOSIS — H43813 Vitreous degeneration, bilateral: Secondary | ICD-10-CM | POA: Diagnosis not present

## 2018-03-05 DIAGNOSIS — H353133 Nonexudative age-related macular degeneration, bilateral, advanced atrophic without subfoveal involvement: Secondary | ICD-10-CM | POA: Diagnosis not present

## 2018-06-03 ENCOUNTER — Encounter: Payer: Self-pay | Admitting: Internal Medicine

## 2018-06-06 ENCOUNTER — Emergency Department (HOSPITAL_COMMUNITY): Payer: Medicare Other | Admitting: Certified Registered"

## 2018-06-06 ENCOUNTER — Ambulatory Visit (HOSPITAL_COMMUNITY)
Admission: EM | Admit: 2018-06-06 | Discharge: 2018-06-06 | Disposition: A | Discharge status: Home / Self Care | Payer: Medicare Other | Attending: Emergency Medicine | Admitting: Emergency Medicine

## 2018-06-06 ENCOUNTER — Encounter (HOSPITAL_COMMUNITY)
Admission: EM | Disposition: A | Discharge status: Home / Self Care | Payer: Self-pay | Source: Home / Self Care | Attending: Emergency Medicine

## 2018-06-06 ENCOUNTER — Encounter (HOSPITAL_COMMUNITY): Payer: Self-pay | Admitting: Emergency Medicine

## 2018-06-06 DIAGNOSIS — Z87891 Personal history of nicotine dependence: Secondary | ICD-10-CM | POA: Diagnosis not present

## 2018-06-06 DIAGNOSIS — R1310 Dysphagia, unspecified: Secondary | ICD-10-CM | POA: Diagnosis not present

## 2018-06-06 DIAGNOSIS — Z881 Allergy status to other antibiotic agents status: Secondary | ICD-10-CM | POA: Insufficient documentation

## 2018-06-06 DIAGNOSIS — T18128A Food in esophagus causing other injury, initial encounter: Secondary | ICD-10-CM

## 2018-06-06 DIAGNOSIS — Z85828 Personal history of other malignant neoplasm of skin: Secondary | ICD-10-CM | POA: Insufficient documentation

## 2018-06-06 DIAGNOSIS — K222 Esophageal obstruction: Secondary | ICD-10-CM

## 2018-06-06 DIAGNOSIS — I10 Essential (primary) hypertension: Secondary | ICD-10-CM | POA: Diagnosis not present

## 2018-06-06 DIAGNOSIS — E559 Vitamin D deficiency, unspecified: Secondary | ICD-10-CM | POA: Diagnosis not present

## 2018-06-06 DIAGNOSIS — X58XXXA Exposure to other specified factors, initial encounter: Secondary | ICD-10-CM | POA: Insufficient documentation

## 2018-06-06 DIAGNOSIS — Z882 Allergy status to sulfonamides status: Secondary | ICD-10-CM | POA: Diagnosis not present

## 2018-06-06 DIAGNOSIS — E039 Hypothyroidism, unspecified: Secondary | ICD-10-CM | POA: Insufficient documentation

## 2018-06-06 DIAGNOSIS — R05 Cough: Secondary | ICD-10-CM | POA: Diagnosis not present

## 2018-06-06 DIAGNOSIS — Z79890 Hormone replacement therapy (postmenopausal): Secondary | ICD-10-CM | POA: Insufficient documentation

## 2018-06-06 DIAGNOSIS — K219 Gastro-esophageal reflux disease without esophagitis: Secondary | ICD-10-CM | POA: Diagnosis not present

## 2018-06-06 DIAGNOSIS — Z79899 Other long term (current) drug therapy: Secondary | ICD-10-CM | POA: Insufficient documentation

## 2018-06-06 DIAGNOSIS — K2270 Barrett's esophagus without dysplasia: Secondary | ICD-10-CM | POA: Insufficient documentation

## 2018-06-06 HISTORY — PX: ESOPHAGOGASTRODUODENOSCOPY (EGD) WITH PROPOFOL: SHX5813

## 2018-06-06 HISTORY — PX: FOREIGN BODY REMOVAL: SHX962

## 2018-06-06 SURGERY — ESOPHAGOGASTRODUODENOSCOPY (EGD) WITH PROPOFOL
Anesthesia: General

## 2018-06-06 MED ORDER — ONDANSETRON HCL 4 MG/2ML IJ SOLN
INTRAMUSCULAR | Status: DC | PRN
  Administered 2018-06-06: 4 mg via INTRAVENOUS

## 2018-06-06 MED ORDER — FENTANYL CITRATE (PF) 250 MCG/5ML IJ SOLN
INTRAMUSCULAR | Status: DC | PRN
  Administered 2018-06-06: 25 ug via INTRAVENOUS

## 2018-06-06 MED ORDER — PROPOFOL 10 MG/ML IV BOLUS
INTRAVENOUS | Status: DC | PRN
  Administered 2018-06-06: 70 mg via INTRAVENOUS

## 2018-06-06 MED ORDER — PROPOFOL 10 MG/ML IV BOLUS
INTRAVENOUS | Status: AC
  Filled 2018-06-06: qty 20

## 2018-06-06 MED ORDER — SODIUM CHLORIDE 0.9 % IV SOLN
INTRAVENOUS | Status: DC
  Administered 2018-06-06: 23:00:00 via INTRAVENOUS

## 2018-06-06 MED ORDER — LIDOCAINE 2% (20 MG/ML) 5 ML SYRINGE
INTRAMUSCULAR | Status: DC | PRN
  Administered 2018-06-06: 40 mg via INTRAVENOUS

## 2018-06-06 MED ORDER — SUCCINYLCHOLINE CHLORIDE 200 MG/10ML IV SOSY
PREFILLED_SYRINGE | INTRAVENOUS | Status: DC | PRN
  Administered 2018-06-06: 100 mg via INTRAVENOUS

## 2018-06-06 MED ORDER — SODIUM CHLORIDE 0.9 % IV SOLN: INTRAVENOUS | Status: DC

## 2018-06-06 MED ORDER — FENTANYL CITRATE (PF) 100 MCG/2ML IJ SOLN
INTRAMUSCULAR | Status: AC
  Filled 2018-06-06: qty 2

## 2018-06-06 SURGICAL SUPPLY — 15 items

## 2018-06-06 NOTE — ED Triage Notes (Signed)
Pt from [facility] was eating steak and some got lodged in her throw and is unable to swallow. Pt unable to swallow salvia and frequently spitting up.

## 2018-06-06 NOTE — Transfer of Care (Signed)
Immediate Anesthesia Transfer of Care Note  Patient: Cheryl Holmes  Procedure(s) Performed: ESOPHAGOGASTRODUODENOSCOPY (EGD) WITH PROPOFOL (N/A ) FOREIGN BODY REMOVAL  Patient Location: PACU  Anesthesia Type:General  Level of Consciousness: awake, alert  and oriented  Airway & Oxygen Therapy: Patient Spontanous Breathing and Patient connected to face mask oxygen  Post-op Assessment: Report given to RN and Post -op Vital signs reviewed and stable  Post vital signs: Reviewed and stable  Last Vitals:  Vitals Value Taken Time  BP    Temp    Pulse    Resp    SpO2      Last Pain:  Vitals:   06/06/18 2226  TempSrc: Oral  PainSc: 0-No pain         Complications: No apparent anesthesia complications

## 2018-06-06 NOTE — ED Provider Notes (Signed)
[facility] DEPT Provider Note   CSN: [id number] Arrival date & time: 06/06/18  2041    History   Chief Complaint Chief Complaint  Patient presents with  . Swallowed Foreign Body    partially swallowed steak     HPI Neola C Kaelin is a 85 y.o. female.     85 year old female presents with foreign body sensation in her upper throat after having steak this evening.  States that she felt to get stuck in her throat and has a emesis of her secretions since then.  Denies any prior history of esophageal strictures.  Called EMS and was transported here.  No treatment used prior to arrival.     Past Medical History:  Diagnosis Date  . Actinic keratosis   . Aftercare following surgery of the skin or subcutaneous tissue   . Basal cell carcinoma of skin of other parts of face   . Bone/cartilage disorder    nose  . Neoplasm of uncertain behavior of skin   . Screening for malignant neoplasm of the rectum   . Thyroid disease     Patient Active Problem List   Diagnosis Date Noted  . Senile purpura ([place]) 07/05/2017  . Vitreous degeneration, bilateral 08/15/2016  . Hypothyroidism 07/06/2016  . Vitamin D deficiency 07/06/2016  . History of shingles 07/06/2016  . Macular degeneration of both eyes 07/06/2016  . ANOREXIA 11/06/2007  . DYSPHAGIA 11/06/2007  . FEVER UNSPECIFIED 10/24/2007  . Perforation of colon as colonoscopy complication ([place]) 28/78/6767    Past Surgical History:  Procedure Laterality Date  . APPENDECTOMY  1939   Dr. Charisse March  . BACK SURGERY  2005   Dr. Micheal Likens  . Cancer Removal Forehead, Basal Cell N/A 11/24/2009  . colonoscopy performation  2000   Dr. Maurene Capes, Dr.Weatherly     OB History   No obstetric history on file.      Home Medications    Prior to Admission medications   Medication Sig Start Date End Date Taking? Authorizing Provider  cholecalciferol (VITAMIN D) 1000 units tablet Take 1,000 Units by  mouth daily.    [provider]  levothyroxine (SYNTHROID, LEVOTHROID) 50 MCG tablet TAKE 1 TABLET BY MOUTH ONCE DAILY BEFORE BREAKFAST 01/10/18   Reed, Tiffany L, DO  Lysine HCl (L-FORMULA LYSINE HCL) 500 MG TABS Take 1 tablet by mouth daily.    [provider]  Multiple Vitamins-Minerals (PRESERVISION AREDS 2) CAPS Take 2 capsules by mouth daily.    [provider]    Family History Family History  Problem Relation Age of Onset  . Kidney disease Mother   . Heart disease Father     Social History Social History   Tobacco Use  . Smoking status: Former Smoker    Types: Cigarettes  . Smokeless tobacco: Never Used  Substance Use Topics  . Alcohol use: Yes  . Drug use: No     Allergies   Polysporin [bacitracin-polymyxin b] and Sulfonamide derivatives   Review of Systems Review of Systems  All other systems reviewed and are negative.    Physical Exam Updated Vital Signs BP (!) 210/71 (BP Location: Left Arm)   Pulse 82   Temp 98.3 F (36.8 C) (Oral)   Resp 17   SpO2 98%   Physical Exam Vitals signs and nursing note reviewed.  Constitutional:      General: She is not in acute distress.    Appearance: Normal appearance. She is well-developed. She is  not toxic-appearing.  HENT:     Head: Normocephalic and atraumatic.  Eyes:     General: Lids are normal.     Conjunctiva/sclera: Conjunctivae normal.     Pupils: Pupils are equal, round, and reactive to light.  Neck:     Musculoskeletal: Normal range of motion and neck supple.     Thyroid: No thyroid mass.     Trachea: No tracheal deviation.  Cardiovascular:     Rate and Rhythm: Normal rate and regular rhythm.     Heart sounds: Normal heart sounds. No murmur. No gallop.   Pulmonary:     Effort: Pulmonary effort is normal. No respiratory distress.     Breath sounds: Normal breath sounds. No stridor. No decreased breath sounds, wheezing, rhonchi or rales.  Abdominal:     General: Bowel  sounds are normal. There is no distension.     Palpations: Abdomen is soft.     Tenderness: There is no abdominal tenderness. There is no rebound.  Musculoskeletal: Normal range of motion.        General: No tenderness.  Skin:    General: Skin is warm and dry.     Findings: No abrasion or rash.  Neurological:     Mental Status: She is alert and oriented to person, place, and time.     GCS: GCS eye subscore is 4. GCS verbal subscore is 5. GCS motor subscore is 6.     Cranial Nerves: No cranial nerve deficit.     Sensory: No sensory deficit.  Psychiatric:        Speech: Speech normal.        Behavior: Behavior normal.      ED Treatments / Results  Labs (all labs ordered are listed, but only abnormal results are displayed) Labs Reviewed - No data to display  EKG None  Radiology No results found.  Procedures Procedures (including critical care time)  Medications Ordered in ED Medications - No data to display   Initial Impression / Assessment and Plan / ED Course  I have reviewed the triage vital signs and the nursing notes.  Pertinent labs & imaging results that were available during my care of the patient were reviewed by me and considered in my medical decision making (see chart for details).        Discussed with Dr. Ardis Hughs from GI who will come and see the patient  Final Clinical Impressions(s) / ED Diagnoses   Final diagnoses:  None    ED Discharge Orders    None       Lacretia Leigh, MD 06/06/18 2111

## 2018-06-06 NOTE — Anesthesia Procedure Notes (Signed)
Procedure Name: Intubation Date/Time: 06/06/2018 10:37 PM Performed by: Niel Hummer, CRNA Pre-anesthesia Checklist: Patient being monitored, Suction available, Emergency Drugs available and Patient identified Patient Re-evaluated:Patient Re-evaluated prior to induction Oxygen Delivery Method: Circle system utilized Induction Type: IV induction, Rapid sequence and Cricoid Pressure applied Laryngoscope Size: Mac and 4 Grade View: Grade I Tube size: 7.0 mm Number of attempts: 1 Airway Equipment and Method: Stylet Placement Confirmation: ETT inserted through vocal cords under direct vision,  positive ETCO2 and breath sounds checked- equal and bilateral Secured at: 18 cm Tube secured with: Tape Dental Injury: Injury to lip  Comments: DL x1 by CRNA, tube placed, ETCO2 normal, difficulty hearing bilateral chest sounds. Confirmed placement with 2nd DL by MDA. Tube in correct position. Small lip lac, lube applied and pressure.

## 2018-06-06 NOTE — ED Notes (Signed)
Patient transported to endoscopy for removal foreign body steak in throat.

## 2018-06-06 NOTE — Consult Note (Signed)
[facility] Referring Provider: ER Dr. Zenia Resides Primary Care Physician:  Gayland Curry, DO Primary Gastroenterologist:  Previous Dr. Olevia Perches  Reason for Consultation: Esophageal food impaction   HPI:  Cheryl Holmes is a 85 y.o. female who was eating steak tonight and felt a piece lodge in her esophagus.  Unable to swallow saliva now.  She does have mild intermittent solid food dysphagia for years.  Only about once per month when she eats fast or doesn't chew enough.  Also admits to chronic GERD, not on any antiacid meds however.  Previous Dr. Olevia Perches patient: EGD 2009 found 11cm segment of Barrett's appearing mucosa. She also described a "large gastric inlet patch at 15cm from the mouth which she dilated with savaory up to 64mm.  Also a 3cm HH.  Pathology showed no sign of intestinal metaplasia  Or EoE.  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  She had a colonoscopy with Dr. Olevia Perches in 2001 and suffered a perforation , she underwent exploratory lap with closure of the perforation.   Past Medical History:  Diagnosis Date  . Actinic keratosis   . Aftercare following surgery of the skin or subcutaneous tissue   . Basal cell carcinoma of skin of other parts of face   . Bone/cartilage disorder    nose  . Neoplasm of uncertain behavior of skin   . Screening for malignant neoplasm of the rectum   . Thyroid disease     Past Surgical History:  Procedure Laterality Date  . APPENDECTOMY  1939   Dr. Charisse March  . BACK SURGERY  2005   Dr. Micheal Likens  . Cancer Removal Forehead, Basal Cell N/A 11/24/2009  . colonoscopy performation  2000   Dr. Maurene Capes, Dr.Weatherly    Prior to Admission medications   Medication Sig Start Date End Date Taking? Authorizing Provider  cholecalciferol (VITAMIN D) 1000 units tablet Take 1,000 Units by mouth daily.    [provider]  levothyroxine (SYNTHROID, LEVOTHROID) 50 MCG tablet TAKE 1 TABLET BY MOUTH ONCE DAILY  BEFORE BREAKFAST 01/10/18   Reed, Tiffany L, DO  Lysine HCl (L-FORMULA LYSINE HCL) 500 MG TABS Take 1 tablet by mouth daily.    [provider]  Multiple Vitamins-Minerals (PRESERVISION AREDS 2) CAPS Take 2 capsules by mouth daily.    [provider]    Current Facility-Administered Medications  Medication Dose Route Frequency Provider Last Rate Last Dose  . 0.9 %  sodium chloride infusion   Intravenous Continuous Lacretia Leigh, MD       Current Outpatient Medications  Medication Sig Dispense Refill  . cholecalciferol (VITAMIN D) 1000 units tablet Take 1,000 Units by mouth daily.    Marland Kitchen levothyroxine (SYNTHROID, LEVOTHROID) 50 MCG tablet TAKE 1 TABLET BY MOUTH ONCE DAILY BEFORE BREAKFAST 90 tablet 1  . Lysine HCl (L-FORMULA LYSINE HCL) 500 MG TABS Take 1 tablet by mouth daily.    . Multiple Vitamins-Minerals (PRESERVISION AREDS 2) CAPS Take 2 capsules by mouth daily.      Allergies as of 06/06/2018 - Review Complete 06/06/2018  Allergen Reaction Noted  . Polysporin [bacitracin-polymyxin b] Anaphylaxis and Rash 06/16/2016  . Sulfonamide derivatives Anaphylaxis and Rash     Family History  Problem Relation Age of Onset  . Kidney disease Mother   . Heart disease Father     Social History   Socioeconomic History  . Marital status: Widowed    Spouse name: Not on file  . Number of  children: 4  . Years of education: Not on file  . Highest education level: Not on file  Occupational History  . Occupation: retired  Scientific laboratory technician  . Financial resource strain: Not on file  . Food insecurity:    Worry: Not on file    Inability: Not on file  . Transportation needs:    Medical: Not on file    Non-medical: Not on file  Tobacco Use  . Smoking status: Former Smoker    Types: Cigarettes  . Smokeless tobacco: Never Used  Substance and Sexual Activity  . Alcohol use: Yes  . Drug use: No  . Sexual activity: Never  Lifestyle  . Physical activity:    Days per week: Not  on file    Minutes per session: Not on file  . Stress: Not on file  Relationships  . Social connections:    Talks on phone: Not on file    Gets together: Not on file    Attends religious service: Not on file    Active member of club or organization: Not on file    Attends meetings of clubs or organizations: Not on file    Relationship status: Not on file  . Intimate partner violence:    Fear of current or ex partner: Not on file    Emotionally abused: Not on file    Physically abused: Not on file    Forced sexual activity: Not on file  Other Topics Concern  . Not on file  Social History Narrative   Tobacco use, amount per day now: NONE   Past tobacco use, amount per day: MINIMAL   How many years did you use tobacco: 3 OR 4    Alcohol use (drinks per week): 3 OR 4   Diet: REGULAR   Do you drink/eat things with caffeine: YES   Marital status:  WIDOWED                                What year were you married? 1951   Do you live in a house, apartment, assisted living, condo, trailer, etc.? APT   Is it one or more stories? YES   How many persons live in your home? A LOT   Do you have pets in your home?( please list) NO   Current or past profession: WORKED [employer]   Do you exercise?     YES                             Type and how often? WALKING- CLASSES   Do you have a living will? YES   Do you have a DNR form?    YES                               If not, do you want to discuss one?   Do you have signed POA/HPOA forms?  YES                      If so, please bring to you appointment     Review of Systems: Pertinent positive and negative review of systems were noted in the above HPI section. Complete review of systems was performed and was otherwise normal.   Physical Exam: Vital signs in last 24 hours: Temp:  [  98.3 F (36.8 C)-98.7 F (37.1 C)] 98.7 F (37.1 C) (04/29 2053) Pulse Rate:  [82-98] 98 (04/29 2053) Resp:  [17] 17 (04/29 2049) BP: (210)/(71) 210/71 (04/29  2049) SpO2:  [97 %-99 %] 97 % (04/29 2053)   Constitutional: generally well-appearing Psychiatric: alert and oriented x3 Eyes: extraocular movements intact Mouth: oral pharynx moist, no lesions Neck: supple no lymphadenopathy Cardiovascular: heart regular rate and rhythm Lungs: clear to auscultation bilaterally Abdomen: soft, nontender, nondistended, no obvious ascites, no peritoneal signs, normal bowel sounds Extremities: no lower extremity edema bilaterally Skin: no lesions on visible extremities    Impression/Plan: 85 y.o. female with esophageal food impaction  For EGD tonight.    Milus Banister, MD  06/06/2018, 9:57 PM [facility] Pager [phone]

## 2018-06-06 NOTE — ED Notes (Signed)
Bed: HG99 Expected date:  Expected time:  Means of arrival:  Comments: EMS 85 yo female steak stuck in throat

## 2018-06-06 NOTE — H&P (View-Only) (Signed)
[facility] Referring Provider: ER Dr. Zenia Resides Primary Care Physician:  Gayland Curry, DO Primary Gastroenterologist:  Previous Dr. Olevia Perches  Reason for Consultation: Esophageal food impaction   HPI:  Cheryl Holmes is a 85 y.o. female who was eating steak tonight and felt a piece lodge in her esophagus.  Unable to swallow saliva now.  She does have mild intermittent solid food dysphagia for years.  Only about once per month when she eats fast or doesn't chew enough.  Also admits to chronic GERD, not on any antiacid meds however.  Previous Dr. Olevia Perches patient: EGD 2009 found 11cm segment of Barrett's appearing mucosa. She also described a "large gastric inlet patch at 15cm from the mouth which she dilated with savaory up to 62mm.  Also a 3cm HH.  Pathology showed no sign of intestinal metaplasia  Or EoE.  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  She had a colonoscopy with Dr. Olevia Perches in 2001 and suffered a perforation , she underwent exploratory lap with closure of the perforation.   Past Medical History:  Diagnosis Date  . Actinic keratosis   . Aftercare following surgery of the skin or subcutaneous tissue   . Basal cell carcinoma of skin of other parts of face   . Bone/cartilage disorder    nose  . Neoplasm of uncertain behavior of skin   . Screening for malignant neoplasm of the rectum   . Thyroid disease     Past Surgical History:  Procedure Laterality Date  . APPENDECTOMY  1939   Dr. Charisse March  . BACK SURGERY  2005   Dr. Micheal Likens  . Cancer Removal Forehead, Basal Cell N/A 11/24/2009  . colonoscopy performation  2000   Dr. Maurene Capes, Dr.Weatherly    Prior to Admission medications   Medication Sig Start Date End Date Taking? Authorizing Provider  cholecalciferol (VITAMIN D) 1000 units tablet Take 1,000 Units by mouth daily.    [provider]  levothyroxine (SYNTHROID, LEVOTHROID) 50 MCG tablet TAKE 1 TABLET BY MOUTH ONCE DAILY  BEFORE BREAKFAST 01/10/18   Reed, Tiffany L, DO  Lysine HCl (L-FORMULA LYSINE HCL) 500 MG TABS Take 1 tablet by mouth daily.    [provider]  Multiple Vitamins-Minerals (PRESERVISION AREDS 2) CAPS Take 2 capsules by mouth daily.    [provider]    Current Facility-Administered Medications  Medication Dose Route Frequency Provider Last Rate Last Dose  . 0.9 %  sodium chloride infusion   Intravenous Continuous Lacretia Leigh, MD       Current Outpatient Medications  Medication Sig Dispense Refill  . cholecalciferol (VITAMIN D) 1000 units tablet Take 1,000 Units by mouth daily.    Marland Kitchen levothyroxine (SYNTHROID, LEVOTHROID) 50 MCG tablet TAKE 1 TABLET BY MOUTH ONCE DAILY BEFORE BREAKFAST 90 tablet 1  . Lysine HCl (L-FORMULA LYSINE HCL) 500 MG TABS Take 1 tablet by mouth daily.    . Multiple Vitamins-Minerals (PRESERVISION AREDS 2) CAPS Take 2 capsules by mouth daily.      Allergies as of 06/06/2018 - Review Complete 06/06/2018  Allergen Reaction Noted  . Polysporin [bacitracin-polymyxin b] Anaphylaxis and Rash 06/16/2016  . Sulfonamide derivatives Anaphylaxis and Rash     Family History  Problem Relation Age of Onset  . Kidney disease Mother   . Heart disease Father     Social History   Socioeconomic History  . Marital status: Widowed    Spouse name: Not on file  . Number of  children: 4  . Years of education: Not on file  . Highest education level: Not on file  Occupational History  . Occupation: retired  Scientific laboratory technician  . Financial resource strain: Not on file  . Food insecurity:    Worry: Not on file    Inability: Not on file  . Transportation needs:    Medical: Not on file    Non-medical: Not on file  Tobacco Use  . Smoking status: Former Smoker    Types: Cigarettes  . Smokeless tobacco: Never Used  Substance and Sexual Activity  . Alcohol use: Yes  . Drug use: No  . Sexual activity: Never  Lifestyle  . Physical activity:    Days per week: Not  on file    Minutes per session: Not on file  . Stress: Not on file  Relationships  . Social connections:    Talks on phone: Not on file    Gets together: Not on file    Attends religious service: Not on file    Active member of club or organization: Not on file    Attends meetings of clubs or organizations: Not on file    Relationship status: Not on file  . Intimate partner violence:    Fear of current or ex partner: Not on file    Emotionally abused: Not on file    Physically abused: Not on file    Forced sexual activity: Not on file  Other Topics Concern  . Not on file  Social History Narrative   Tobacco use, amount per day now: NONE   Past tobacco use, amount per day: MINIMAL   How many years did you use tobacco: 3 OR 4    Alcohol use (drinks per week): 3 OR 4   Diet: REGULAR   Do you drink/eat things with caffeine: YES   Marital status:  WIDOWED                                What year were you married? 1951   Do you live in a house, apartment, assisted living, condo, trailer, etc.? APT   Is it one or more stories? YES   How many persons live in your home? A LOT   Do you have pets in your home?( please list) NO   Current or past profession: WORKED [employer]   Do you exercise?     YES                             Type and how often? WALKING- CLASSES   Do you have a living will? YES   Do you have a DNR form?    YES                               If not, do you want to discuss one?   Do you have signed POA/HPOA forms?  YES                      If so, please bring to you appointment     Review of Systems: Pertinent positive and negative review of systems were noted in the above HPI section. Complete review of systems was performed and was otherwise normal.   Physical Exam: Vital signs in last 24 hours: Temp:  [  98.3 F (36.8 C)-98.7 F (37.1 C)] 98.7 F (37.1 C) (04/29 2053) Pulse Rate:  [82-98] 98 (04/29 2053) Resp:  [17] 17 (04/29 2049) BP: (210)/(71) 210/71 (04/29  2049) SpO2:  [97 %-99 %] 97 % (04/29 2053)   Constitutional: generally well-appearing Psychiatric: alert and oriented x3 Eyes: extraocular movements intact Mouth: oral pharynx moist, no lesions Neck: supple no lymphadenopathy Cardiovascular: heart regular rate and rhythm Lungs: clear to auscultation bilaterally Abdomen: soft, nontender, nondistended, no obvious ascites, no peritoneal signs, normal bowel sounds Extremities: no lower extremity edema bilaterally Skin: no lesions on visible extremities    Impression/Plan: 85 y.o. female with esophageal food impaction  For EGD tonight.    Milus Banister, MD  06/06/2018, 9:57 PM [facility] Pager [phone]

## 2018-06-06 NOTE — Op Note (Signed)
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 Olevia Perches EGD 2009) Providers:                Milus Banister, MD, Carlyn Reichert, RN, Laverda Sorenson, Technician, Leandro Reasoner, CRNA Referring MD:              Medicines:                General Anesthesia Complications:            No immediate complications. Estimated blood loss:                            None. Estimated Blood Loss:     Estimated blood loss: none. Procedure:                Pre-Anesthesia Assessment:                           - Prior to the procedure, a History and Physical                            was performed, and patient medications and                            allergies were reviewed. The patient's tolerance of                            previous anesthesia was also reviewed. The risks                            and benefits of the procedure and the sedation                            options and risks were discussed with the patient.                            All questions were answered, and informed consent                            was obtained. Prior Anticoagulants: The patient has  taken no previous anticoagulant or antiplatelet                            agents. ASA Grade Assessment: III - A patient with                            severe systemic disease. After reviewing the risks                            and benefits, the patient was deemed in                            satisfactory condition to undergo the procedure.                           After obtaining  informed consent, the endoscope was                            passed under direct vision. Throughout the                            procedure, the patient's blood pressure, pulse, and                            oxygen saturations were monitored continuously. The                            GIF-H190 ([id number]) Olympus gastroscope was                            introduced through the mouth, and advanced to the                            body of the stomach. The upper GI endoscopy was                            accomplished without difficulty. The patient                            tolerated the procedure well. Scope In: Scope Out: Findings:      A medium sized, fibrous food bolus was encountered in the proximal       esohagus and was gently pushed into the stomach.      A mild Schatzki's ring was found at the GE junction.      There was a 2cm typical appearing gastric inlet patch in the proximal       esophagus (about 15cm from the incisors) with a mild associated benign       appearing stricture. This was the site of the food impaction.      The stomach was normal. Impression:               - Food in the lower third of the esophagus.                           -  No specimens collected. Moderate Sedation:      Not Applicable - Patient had care per Anesthesia. Recommendation:           - Patient has a contact number available for                            emergencies. The signs and symptoms of potential                            delayed complications were discussed with the                            patient. Return to normal activities tomorrow.                            Written discharge instructions were provided to the                            patient.                           - Resume previous diet.                           - Continue present medications.                           - Start once daily omeprazole (20mg  pill OTC                            strength).                            - Chew your food well, eat slowly and take small                            bites.                           - [facility] will contact you about follow up                            office visit. Procedure Code(s):        --- Professional ---                           [phone], 52, Esophagogastroduodenoscopy, flexible,                            transoral; diagnostic, including collection of                            specimen(s) by brushing or washing, when performed                            (separate procedure) Diagnosis Code(s):        --- Professional ---  B40.370D, Food in esophagus causing other injury,                            initial encounter                           R13.10, Dysphagia, unspecified CPT copyright 2019 American Medical Association. All rights reserved. The codes documented in this report are preliminary and upon coder review may  be revised to meet current compliance requirements. Milus Banister, MD 06/06/2018 10:57:23 PM This report has been signed electronically. Number of Addenda: 0

## 2018-06-06 NOTE — Anesthesia Preprocedure Evaluation (Signed)
Anesthesia Evaluation  Patient identified by MRN, date of birth, ID band Patient awake    Reviewed: Allergy & Precautions, NPO status , Patient's Chart, lab work & pertinent test results  Airway Mallampati: II  TM Distance: >3 FB Neck ROM: Full    Dental  (+) Teeth Intact, Dental Advisory Given   Pulmonary former smoker,    breath sounds clear to auscultation       Cardiovascular  Rhythm:Regular Rate:Normal     Neuro/Psych    GI/Hepatic   Endo/Other    Renal/GU      Musculoskeletal   Abdominal   Peds  Hematology   Anesthesia Other Findings   Reproductive/Obstetrics                             Anesthesia Physical Anesthesia Plan  ASA: II and emergent  Anesthesia Plan: General   Post-op Pain Management:    Induction: Intravenous, Rapid sequence and Cricoid pressure planned  PONV Risk Score and Plan: Ondansetron and Dexamethasone  Airway Management Planned: Oral ETT  Additional Equipment:   Intra-op Plan:   Post-operative Plan: Extubation in OR  Informed Consent: I have reviewed the patients History and Physical, chart, labs and discussed the procedure including the risks, benefits and alternatives for the proposed anesthesia with the patient or authorized representative who has indicated his/her understanding and acceptance.     Dental advisory given  Plan Discussed with: CRNA and Anesthesiologist  Anesthesia Plan Comments:         Anesthesia Quick Evaluation

## 2018-06-06 NOTE — Discharge Instructions (Signed)
YOU HAD AN ENDOSCOPIC PROCEDURE TODAY: Refer to the procedure report and other information in the discharge instructions given to you for any specific questions about what was found during the examination. If this information does not answer your questions, please call [place] office at [phone] to clarify.   YOU SHOULD EXPECT: Some feelings of bloating in the abdomen. Passage of more gas than usual. Walking can help get rid of the air that was put into your GI tract during the procedure and reduce the bloating. If you had a lower endoscopy (such as a colonoscopy or flexible sigmoidoscopy) you may notice spotting of blood in your stool or on the toilet paper. Some abdominal soreness may be present for a day or two, also.  DIET: Your first meal following the procedure should be a light meal and then it is ok to progress to your normal diet. A half-sandwich or bowl of soup is an example of a good first meal. Heavy or fried foods are harder to digest and may make you feel nauseous or bloated. Drink plenty of fluids but you should avoid alcoholic beverages for 24 hours. If you had a esophageal dilation, please see attached instructions for diet.    ACTIVITY: Your care partner should take you home directly after the procedure. You should plan to take it easy, moving slowly for the rest of the day. You can resume normal activity the day after the procedure however YOU SHOULD NOT DRIVE, use power tools, machinery or perform tasks that involve climbing or major physical exertion for 24 hours (because of the sedation medicines used during the test).   SYMPTOMS TO REPORT IMMEDIATELY: A gastroenterologist can be reached at any hour. Please call [phone]  for any of the following symptoms:   Following upper endoscopy (EGD, EUS, ERCP, esophageal dilation) Vomiting of blood or coffee ground material  New, significant abdominal pain  New, significant chest pain or pain under the shoulder blades  Painful or  persistently difficult swallowing  New shortness of breath  Black, tarry-looking or red, bloody stools  FOLLOW UP:  If any biopsies were taken you will be contacted by phone or by letter within the next 1-3 weeks. Call [phone]  if you have not heard about the biopsies in 3 weeks.  Please also call with any specific questions about appointments or follow up tests.  

## 2018-06-06 NOTE — Interval H&P Note (Signed)
History and Physical Interval Note:  06/06/2018 10:41 PM  Cheryl Holmes  has presented today for surgery, with the diagnosis of esopahgeal food impaction.  The various methods of treatment have been discussed with the patient and family. After consideration of risks, benefits and other options for treatment, the patient has consented to  Procedure(s): ESOPHAGOGASTRODUODENOSCOPY (EGD) WITH PROPOFOL (N/A) as a surgical intervention.  The patient's history has been reviewed, patient examined, no change in status, stable for surgery.  I have reviewed the patient's chart and labs.  Questions were answered to the patient's satisfaction.     Milus Banister

## 2018-06-07 ENCOUNTER — Encounter (HOSPITAL_COMMUNITY): Payer: Self-pay | Admitting: Gastroenterology

## 2018-06-07 NOTE — Anesthesia Postprocedure Evaluation (Signed)
Anesthesia Post Note  Patient: Cheryl Holmes  Procedure(s) Performed: ESOPHAGOGASTRODUODENOSCOPY (EGD) WITH PROPOFOL (N/A ) FOREIGN BODY REMOVAL     Patient location during evaluation: PACU Anesthesia Type: General Level of consciousness: awake and alert Pain management: pain level controlled Vital Signs Assessment: post-procedure vital signs reviewed and stable Respiratory status: spontaneous breathing, nonlabored ventilation, respiratory function stable and patient connected to nasal cannula oxygen Cardiovascular status: blood pressure returned to baseline and stable Postop Assessment: no apparent nausea or vomiting Anesthetic complications: no    Last Vitals:  Vitals:   06/06/18 2226 06/06/18 2300  BP: (!) 225/74 (!) 171/66  Pulse: 81   Resp: (!) 22   Temp: 37 C   SpO2: 98% 98%    Last Pain:  Vitals:   06/06/18 2300  TempSrc:   PainSc: 0-No pain                 Vallery Mcdade COKER

## 2018-06-26 ENCOUNTER — Encounter: Payer: Self-pay | Admitting: Internal Medicine

## 2018-06-26 DIAGNOSIS — E785 Hyperlipidemia, unspecified: Secondary | ICD-10-CM | POA: Diagnosis not present

## 2018-06-26 DIAGNOSIS — D649 Anemia, unspecified: Secondary | ICD-10-CM | POA: Diagnosis not present

## 2018-06-26 DIAGNOSIS — I10 Essential (primary) hypertension: Secondary | ICD-10-CM | POA: Diagnosis not present

## 2018-06-26 DIAGNOSIS — E039 Hypothyroidism, unspecified: Secondary | ICD-10-CM | POA: Diagnosis not present

## 2018-06-26 LAB — LIPID PANEL
Cholesterol: 197 (ref 0–200)
HDL: 61 (ref 35–70)
LDL Cholesterol: 116
Triglycerides: 99 (ref 40–160)

## 2018-06-26 LAB — HEPATIC FUNCTION PANEL
ALT: 10 (ref 7–35)
AST: 16 (ref 13–35)
Alkaline Phosphatase: 99 (ref 25–125)
Bilirubin, Total: 0.4

## 2018-06-26 LAB — TSH: TSH: 2.75 (ref 0.41–5.90)

## 2018-06-26 LAB — BASIC METABOLIC PANEL
BUN: 20 (ref 4–21)
Creatinine: 0.7 (ref 0.5–1.1)
Glucose: 100
Potassium: 4.8 (ref 3.4–5.3)
Sodium: 140 (ref 137–147)

## 2018-06-26 LAB — CBC AND DIFFERENTIAL
HCT: 44 (ref 36–46)
Hemoglobin: 14.8 (ref 12.0–16.0)
Platelets: 264 (ref 150–399)
WBC: 7.1

## 2018-07-03 ENCOUNTER — Encounter: Payer: Self-pay | Admitting: Internal Medicine

## 2018-07-04 ENCOUNTER — Non-Acute Institutional Stay: Payer: Medicare Other | Admitting: Internal Medicine

## 2018-07-04 ENCOUNTER — Encounter: Payer: Self-pay | Admitting: Internal Medicine

## 2018-07-04 ENCOUNTER — Other Ambulatory Visit: Payer: Self-pay

## 2018-07-04 VITALS — BP 120/60 | HR 52 | Temp 98.3°F | Ht 63.0 in | Wt 110.0 lb

## 2018-07-04 DIAGNOSIS — D692 Other nonthrombocytopenic purpura: Secondary | ICD-10-CM | POA: Diagnosis not present

## 2018-07-04 DIAGNOSIS — R1319 Other dysphagia: Secondary | ICD-10-CM

## 2018-07-04 DIAGNOSIS — E559 Vitamin D deficiency, unspecified: Secondary | ICD-10-CM | POA: Diagnosis not present

## 2018-07-04 DIAGNOSIS — T18128A Food in esophagus causing other injury, initial encounter: Secondary | ICD-10-CM

## 2018-07-04 DIAGNOSIS — H353130 Nonexudative age-related macular degeneration, bilateral, stage unspecified: Secondary | ICD-10-CM | POA: Diagnosis not present

## 2018-07-04 DIAGNOSIS — K222 Esophageal obstruction: Secondary | ICD-10-CM | POA: Diagnosis not present

## 2018-07-04 DIAGNOSIS — E039 Hypothyroidism, unspecified: Secondary | ICD-10-CM

## 2018-07-04 DIAGNOSIS — W44F3XA Food entering into or through a natural orifice, initial encounter: Secondary | ICD-10-CM

## 2018-07-04 NOTE — Progress Notes (Signed)
Location:  [facility]upational psychologist of Service:  Clinic (12)  Provider: Selassie Spatafore L. Mariea Clonts, D.O., C.M.D.  Code Status: DNR  Goals of Care:  Advanced Directives 06/06/2018  Does Patient Have a Medical Advance Directive? Yes  Type of Advance Directive Out of facility DNR (pink MOST or yellow form)  Does patient want to make changes to medical advance directive? -  Copy of [place] in Chart? -  Pre-existing out of facility DNR order (yellow form or pink MOST form) -   Chief Complaint  Patient presents with  . Medical Management of Chronic Issues    follow-up, extended visit    HPI: Patient is a 85 y.o. female seen today for medical management of chronic diseases.    She and Marchia Bond married since we last met.    She was eating steak late last month and a piece got lodged and would not go down.  She wound up with an endoscopy.  Dr. Maurene Capes had stretched her esophagus 9 yrs ago and this was done again then.  Says it went well and they were nice.   Dr. Ardis Hughs took care of her.    She does not walk as much as she once did.  She admits this.  She did walk 1.5 miles today.  She is busy with Marchia Bond.  He needs a lot of care.  She is with him most of the time.  Weight is only up 3 lbs.  Wants to stay fitting hin her clothes.  She's drinking less wine b/c of not going to the dining room, but they do have a drink.  She will have bourbon in water or vodka with lemon.  It's an ounce.    She has to be careful and chew up her food well.  She does not want to take anything for reflux due to the potential side effects on bones.    She is taking bilberry and saffron.  She sees Dr. Baird Cancer and Dr. Prudencio Burly.  Her eyes are bad but holding their own.  She also takes her preservision areds 2 for her macular degeneration.  Dr. Baird Cancer gave her new otc eye drops.  She cannot drive somewhere new b/c of inability to read road signs.    Thyroid normal.    Discussed getting tdap at  [facility].    Past Medical History:  Diagnosis Date  . Actinic keratosis   . Aftercare following surgery of the skin or subcutaneous tissue   . Basal cell carcinoma of skin of other parts of face   . Bone/cartilage disorder    nose  . Neoplasm of uncertain behavior of skin   . Screening for malignant neoplasm of the rectum   . Thyroid disease     Past Surgical History:  Procedure Laterality Date  . APPENDECTOMY  1939   Dr. Charisse March  . BACK SURGERY  2005   Dr. Micheal Likens  . Cancer Removal Forehead, Basal Cell N/A 11/24/2009  . colonoscopy performation  2000   Dr. Maurene Capes, Dr.Weatherly  . ESOPHAGOGASTRODUODENOSCOPY (EGD) WITH PROPOFOL N/A 06/06/2018   Procedure: ESOPHAGOGASTRODUODENOSCOPY (EGD) WITH PROPOFOL;  Surgeon: Milus Banister, MD;  Location: [facility];  Service: Endoscopy;  Laterality: N/A;  . FOREIGN BODY REMOVAL  06/06/2018   Procedure: FOREIGN BODY REMOVAL;  Surgeon: Milus Banister, MD;  Location: [facility];  Service: Endoscopy;;    Allergies  Allergen Reactions  . Polysporin [Bacitracin-Polymyxin B] Anaphylaxis and Rash  .  Sulfonamide Derivatives Anaphylaxis and Rash    REACTION: hive    Outpatient Encounter Medications as of 07/04/2018  Medication Sig  . cholecalciferol (VITAMIN D) 1000 units tablet Take 1,000 Units by mouth daily.  . Multiple Vitamins-Minerals (PRESERVISION AREDS 2) CAPS Take 2 capsules by mouth daily.  . [DISCONTINUED] Lysine HCl (L-FORMULA LYSINE HCL) 500 MG TABS Take 1 tablet by mouth daily.   No facility-administered encounter medications on file as of 07/04/2018.     Review of Systems:  Review of Systems  Constitutional: Negative for chills, fever, malaise/fatigue and weight loss.  HENT: Negative for congestion and hearing loss.   Eyes: Negative for blurred vision.  Respiratory: Negative for cough and shortness of breath.   Cardiovascular: Negative for chest pain, palpitations and leg swelling.  Gastrointestinal: Negative for  abdominal pain, blood in stool, constipation, diarrhea, heartburn, melena, nausea and vomiting.       See hpi about impaction  Genitourinary: Negative for dysuria, frequency and urgency.  Musculoskeletal: Positive for joint pain. Negative for falls.       Foot and ankle that she previously injured "will never be the same"  Skin: Negative for itching and rash.  Neurological: Negative for dizziness and loss of consciousness.  Endo/Heme/Allergies: Bruises/bleeds easily.  Psychiatric/Behavioral: Negative for depression and memory loss. The patient is not nervous/anxious and does not have insomnia.     Health Maintenance  Topic Date Due  . TETANUS/TDAP  05/22/1945  . DEXA SCAN  05/21/1993  . INFLUENZA VACCINE  09/08/2018  . PNA vac Low Risk Adult  Completed    Physical Exam: Vitals:   07/04/18 1424  BP: 120/60  Pulse: (!) 52  Temp: 98.3 F (36.8 C)  TempSrc: Oral  SpO2: 99%  Weight: 110 lb (49.9 kg)  Height: _0  (1.6 m)   Body mass index is 19.49 kg/m. Physical Exam Vitals signs reviewed.  Constitutional:      General: She is not in acute distress.    Appearance: Normal appearance. She is normal weight. She is not ill-appearing or toxic-appearing.  HENT:     Head: Normocephalic and atraumatic.     Right Ear: Tympanic membrane, ear canal and external ear normal.     Left Ear: Tympanic membrane, ear canal and external ear normal.     Nose:     Comments: Deferred nose and mouth with covid masking Neck:     Musculoskeletal: Normal range of motion and neck supple. No neck rigidity or muscular tenderness.  Cardiovascular:     Rate and Rhythm: Normal rate and regular rhythm.     Pulses: Normal pulses.     Heart sounds: Normal heart sounds.  Pulmonary:     Effort: Pulmonary effort is normal.     Breath sounds: Normal breath sounds.  Abdominal:     General: Abdomen is flat. Bowel sounds are normal. There is no distension.     Palpations: There is no mass.     Tenderness:  There is no abdominal tenderness. There is no guarding or rebound.  Musculoskeletal: Normal range of motion.  Lymphadenopathy:     Cervical: No cervical adenopathy.  Skin:    General: Skin is warm and dry.     Capillary Refill: Capillary refill takes less than 2 seconds.  Neurological:     General: No focal deficit present.     Mental Status: She is alert and oriented to person, place, and time. Mental status is at baseline.     Cranial Nerves:  No cranial nerve deficit.     Sensory: No sensory deficit.     Motor: No weakness.     Coordination: Coordination normal.     Gait: Gait normal.     Deep Tendon Reflexes: Reflexes normal.  Psychiatric:        Mood and Affect: Mood normal.        Behavior: Behavior normal.        Thought Content: Thought content normal.        Judgment: Judgment normal.     Labs reviewed: Basic Metabolic Panel: Recent Labs    06/26/18 0800  NA 140  K 4.8  BUN 20  CREATININE 0.7  TSH 2.75   Liver Function Tests: Recent Labs    06/26/18 0800  AST 16  ALT 10  ALKPHOS 99   No results for input(s): LIPASE, AMYLASE in the last 8760 hours. No results for input(s): AMMONIA in the last 8760 hours. CBC: Recent Labs    06/26/18 0800  WBC 7.1  HGB 14.8  HCT 44  PLT 264   Lipid Panel: Recent Labs    06/26/18 0800  CHOL 197  HDL 61  LDLCALC 116  TRIG 99   No results found for: HGBA1C  EGD and hospital notes reviewed  Assessment/Plan 1. Esophageal obstruction due to food impaction -resolved with endoscopy -pt chewing better and avoiding foods like steak  2. DYSPHAGIA -with episode above -due to scar tissue from some gerd  3. Senile purpura ([place]) -noted related to aging and thinning skin  4. Vitamin D deficiency -cont D3 2000 units recommended  5. Bilateral nonexudative age-related macular degeneration, unspecified stage -cont AREDS2 per ophtho and regular f/u  6. Hypothyroidism, unspecified type -has not required  medication, monitor  Labs/tests ordered:  Cbc, cmp, tsh, flp annually Next appt:  1 yr for EV  Jaquila Santelli L. Kacy Conely, D.O. [facility] [address], [place] [address] Cell Phone (Mon-Fri 8am-5pm):  [phone] On Call:  [phone] & follow prompts after 5pm & weekends Office Phone:  [phone] Office Fax:  [phone]

## 2018-07-13 ENCOUNTER — Other Ambulatory Visit: Payer: Self-pay | Admitting: Internal Medicine

## 2018-07-13 NOTE — Telephone Encounter (Signed)
Should pt be on this medication? It was removed from her discharged summary, please advise

## 2018-07-13 NOTE — Telephone Encounter (Signed)
It looks like it was incorrectly stopped.  Please call her and verify.  Her last TSH was normal.  I don't know why it would have been stopped.

## 2018-07-17 DIAGNOSIS — L821 Other seborrheic keratosis: Secondary | ICD-10-CM | POA: Diagnosis not present

## 2018-07-17 DIAGNOSIS — L814 Other melanin hyperpigmentation: Secondary | ICD-10-CM | POA: Diagnosis not present

## 2018-07-17 DIAGNOSIS — B372 Candidiasis of skin and nail: Secondary | ICD-10-CM | POA: Diagnosis not present

## 2018-07-17 DIAGNOSIS — D1801 Hemangioma of skin and subcutaneous tissue: Secondary | ICD-10-CM | POA: Diagnosis not present

## 2018-07-17 DIAGNOSIS — L570 Actinic keratosis: Secondary | ICD-10-CM | POA: Diagnosis not present

## 2018-10-09 DIAGNOSIS — Z23 Encounter for immunization: Secondary | ICD-10-CM | POA: Diagnosis not present

## 2018-10-31 ENCOUNTER — Telehealth: Payer: Self-pay | Admitting: *Deleted

## 2018-10-31 NOTE — Telephone Encounter (Signed)
Can you please ask Katharine Look about the latest with Tootsie?  It sounds like she needs a CT scan to evaluate her abdomen.

## 2018-10-31 NOTE — Telephone Encounter (Signed)
Spoke with Katharine Look. Per Katharine Look her and Dr.Reed has conversed about patient and concern presented.

## 2018-10-31 NOTE — Telephone Encounter (Signed)
Patient called and stated that she wanted to see Dr. Mariea Clonts today at [facility]. No availability. Offered appointment at office, refused.   Patient stated that she is having abdominal discomfort. No fever. No diarrhea. No constipation. Abdomen is Hard and distended. Stated that it is very uncomfortable. Just started this week. Stated that she has spoken with Katharine Look, the nurse regarding this. Please Advise.

## 2018-12-03 DIAGNOSIS — H524 Presbyopia: Secondary | ICD-10-CM | POA: Diagnosis not present

## 2018-12-03 DIAGNOSIS — H04123 Dry eye syndrome of bilateral lacrimal glands: Secondary | ICD-10-CM | POA: Diagnosis not present

## 2018-12-03 DIAGNOSIS — Z961 Presence of intraocular lens: Secondary | ICD-10-CM | POA: Diagnosis not present

## 2018-12-03 DIAGNOSIS — H353131 Nonexudative age-related macular degeneration, bilateral, early dry stage: Secondary | ICD-10-CM | POA: Diagnosis not present

## 2019-02-14 ENCOUNTER — Other Ambulatory Visit: Payer: Self-pay | Admitting: Internal Medicine

## 2019-02-19 DIAGNOSIS — Z23 Encounter for immunization: Secondary | ICD-10-CM | POA: Diagnosis not present

## 2019-03-12 DIAGNOSIS — H353133 Nonexudative age-related macular degeneration, bilateral, advanced atrophic without subfoveal involvement: Secondary | ICD-10-CM | POA: Diagnosis not present

## 2019-03-12 DIAGNOSIS — H43393 Other vitreous opacities, bilateral: Secondary | ICD-10-CM | POA: Diagnosis not present

## 2019-03-12 DIAGNOSIS — H43813 Vitreous degeneration, bilateral: Secondary | ICD-10-CM | POA: Diagnosis not present

## 2019-03-19 DIAGNOSIS — Z23 Encounter for immunization: Secondary | ICD-10-CM | POA: Diagnosis not present

## 2019-03-26 DIAGNOSIS — H3530 Unspecified macular degeneration: Secondary | ICD-10-CM | POA: Diagnosis not present

## 2019-07-09 ENCOUNTER — Encounter: Payer: Self-pay | Admitting: Internal Medicine

## 2019-07-10 ENCOUNTER — Other Ambulatory Visit: Payer: Self-pay

## 2019-07-10 ENCOUNTER — Non-Acute Institutional Stay: Payer: Medicare Other | Admitting: Internal Medicine

## 2019-07-10 ENCOUNTER — Encounter: Payer: Self-pay | Admitting: Internal Medicine

## 2019-07-10 VITALS — BP 118/62 | HR 62 | Temp 97.5°F | Ht 63.0 in | Wt 110.0 lb

## 2019-07-10 DIAGNOSIS — D692 Other nonthrombocytopenic purpura: Secondary | ICD-10-CM

## 2019-07-10 DIAGNOSIS — H353130 Nonexudative age-related macular degeneration, bilateral, stage unspecified: Secondary | ICD-10-CM | POA: Diagnosis not present

## 2019-07-10 DIAGNOSIS — E039 Hypothyroidism, unspecified: Secondary | ICD-10-CM | POA: Diagnosis not present

## 2019-07-10 DIAGNOSIS — R1319 Other dysphagia: Secondary | ICD-10-CM | POA: Diagnosis not present

## 2019-07-10 NOTE — Progress Notes (Signed)
Location:  Occupational psychologist of Service:  Clinic (12)  Provider: Derrisha Foos L. Mariea Clonts, D.O., C.M.D.  Code Status: DNR Goals of Care:  Advanced Directives 07/10/2019  Does Patient Have a Medical Advance Directive? Yes  Type of Advance Directive [place]  Does patient want to make changes to medical advance directive? No - Patient declined  Copy of [place] in Chart? Yes - validated most recent copy scanned in chart (See row information)  Pre-existing out of facility DNR order (yellow form or pink MOST form) -   Chief Complaint  Patient presents with  . Medical Management of Chronic Issues    1 year follow up    HPI: Patient is a 86 y.o. female seen today for medical management of chronic diseases.  She stays busy caring for her husband, Ollen Bowl.  She used to walk 2 miles per day and do back to back exercise classes.  She does one mile a day and does stairs.    Swallowing remains an issue.  Even a little water can go down the wrong way.  She avoids hamburgers, fried chicken and BBQ.  Can't eat grease anymore.    She is doing great.  Says there is nothing I can fix and she's old.    Eyes are getting worse--"can't see a damn thing"--has a valid driver's license until 89.  Can drive if she knows where she is.  Macular degeneration.  Has the dry variety--takes preservision and fish oil, eye drops.    She has nothing that hurts unless it's her conscience.  The left lower leg and tendon might hurt sometimes.  Has hubbard's shoes.    She does not want a bone density.    Her dentist reports her teeth are in good shape, as well as hair.    Past Medical History:  Diagnosis Date  . Actinic keratosis   . Aftercare following surgery of the skin or subcutaneous tissue   . Basal cell carcinoma of skin of other parts of face   . Bone/cartilage disorder    nose  . Neoplasm of uncertain behavior of skin   . Screening for  malignant neoplasm of the rectum   . Thyroid disease     Past Surgical History:  Procedure Laterality Date  . APPENDECTOMY  1939   Dr. Charisse March  . BACK SURGERY  2005   Dr. Micheal Likens  . Cancer Removal Forehead, Basal Cell N/A 11/24/2009  . colonoscopy performation  2000   Dr. Maurene Capes, Dr.Weatherly  . ESOPHAGOGASTRODUODENOSCOPY (EGD) WITH PROPOFOL N/A 06/06/2018   Procedure: ESOPHAGOGASTRODUODENOSCOPY (EGD) WITH PROPOFOL;  Surgeon: Milus Banister, MD;  Location: [facility];  Service: Endoscopy;  Laterality: N/A;  . FOREIGN BODY REMOVAL  06/06/2018   Procedure: FOREIGN BODY REMOVAL;  Surgeon: Milus Banister, MD;  Location: [facility];  Service: Endoscopy;;    Allergies  Allergen Reactions  . Polysporin [Bacitracin-Polymyxin B] Anaphylaxis and Rash  . Sulfonamide Derivatives Anaphylaxis and Rash    REACTION: hive    Outpatient Encounter Medications as of 07/10/2019  Medication Sig  . cholecalciferol (VITAMIN D) 1000 units tablet Take 2,000 Units by mouth daily.  Marland Kitchen levothyroxine (SYNTHROID) 50 MCG tablet TAKE 1 TABLET BY MOUTH ONCE DAILY BEFORE BREAKFAST  . Multiple Vitamins-Minerals (PRESERVISION AREDS 2) CAPS Take 2 capsules by mouth daily.   No facility-administered encounter medications on file as of 07/10/2019.    Review of Systems:  Review  of Systems  Constitutional: Negative for chills, fever and malaise/fatigue.  HENT: Negative for congestion, hearing loss and sore throat.   Eyes: Positive for blurred vision.  Respiratory: Negative for cough and shortness of breath.   Cardiovascular: Negative for chest pain, palpitations and leg swelling.  Gastrointestinal: Positive for heartburn. Negative for abdominal pain, blood in stool, constipation, diarrhea and melena.       Dysphagia  Genitourinary: Negative for dysuria.  Musculoskeletal: Negative for falls.       Occasional pain in feet and lower legs from tendon problems  Skin: Negative for itching and rash.    Neurological: Negative for dizziness and loss of consciousness.  Endo/Heme/Allergies: Bruises/bleeds easily.  Psychiatric/Behavioral: Negative for depression and memory loss. The patient is not nervous/anxious and does not have insomnia.     Health Maintenance  Topic Date Due  . DEXA SCAN  07/09/2020 (Originally 05/21/1993)  . INFLUENZA VACCINE  09/08/2019  . TETANUS/TDAP  10/08/2028  . COVID-19 Vaccine  Completed  . PNA vac Low Risk Adult  Completed    Physical Exam: Vitals:   07/10/19 1438  BP: 118/62  Pulse: 62  Temp: (!) 97.5 F (36.4 C)  TempSrc: Temporal  SpO2: 97%  Weight: 110 lb (49.9 kg)  Height: 5\' 3"  (1.6 m)   Body mass index is 19.49 kg/m. Physical Exam Vitals reviewed.  Constitutional:      General: She is not in acute distress.    Appearance: Normal appearance. She is not ill-appearing or toxic-appearing.     Comments: Thin female  HENT:     Head: Normocephalic and atraumatic.     Right Ear: Tympanic membrane, ear canal and external ear normal.     Left Ear: Tympanic membrane, ear canal and external ear normal.     Nose: Nose normal.     Mouth/Throat:     Pharynx: Oropharynx is clear.  Eyes:     Extraocular Movements: Extraocular movements intact.     Conjunctiva/sclera: Conjunctivae normal.     Pupils: Pupils are equal, round, and reactive to light.  Cardiovascular:     Rate and Rhythm: Normal rate and regular rhythm.     Pulses: Normal pulses.     Heart sounds: Normal heart sounds. No murmur. No friction rub. No gallop.   Pulmonary:     Effort: Pulmonary effort is normal.     Breath sounds: Normal breath sounds. No wheezing, rhonchi or rales.  Abdominal:     General: Bowel sounds are normal. There is no distension.     Palpations: Abdomen is soft. There is no mass.     Tenderness: There is no abdominal tenderness. There is no guarding or rebound.  Musculoskeletal:     Cervical back: Neck supple.  Lymphadenopathy:     Cervical: No cervical  adenopathy.  Neurological:     Mental Status: She is alert.     Labs reviewed: Basic Metabolic Panel: No results for input(s): NA, K, CL, CO2, GLUCOSE, BUN, CREATININE, CALCIUM, MG, PHOS, TSH in the last 8760 hours. Liver Function Tests: No results for input(s): AST, ALT, ALKPHOS, BILITOT, PROT, ALBUMIN in the last 8760 hours. No results for input(s): LIPASE, AMYLASE in the last 8760 hours. No results for input(s): AMMONIA in the last 8760 hours. CBC: No results for input(s): WBC, NEUTROABS, HGB, HCT, MCV, PLT in the last 8760 hours. Lipid Panel: No results for input(s): CHOL, HDL, LDLCALC, TRIG, CHOLHDL, LDLDIRECT in the last 8760 hours. No results found for: HGBA1C Overdue  for labs  Assessment/Plan 1. DYSPHAGIA -continues her modified diet, does not want endoscopic intervention  2. Senile purpura ([facility]) -mild, due to advanced age and thin skin  3. Hypothyroidism, unspecified type -cont current levothyroxine and check tsh in am  4. Bilateral nonexudative age-related macular degeneration, unspecified stage -continue vitamins and avoid driving in unfamiliar places  Labs/tests ordered:  Cbc, cmp, tsh, flp in am Next appt:  1 year  Carlinda Ohlson L. Aquila Menzie, D.O. [facility] [address], [place] [address] Cell Phone (Mon-Fri 8am-5pm):  [phone] On Call:  [phone] & follow prompts after 5pm & weekends Office Phone:  [phone] Office Fax:  [phone]

## 2019-07-11 ENCOUNTER — Encounter: Payer: Self-pay | Admitting: Internal Medicine

## 2019-07-11 DIAGNOSIS — E039 Hypothyroidism, unspecified: Secondary | ICD-10-CM | POA: Diagnosis not present

## 2019-07-11 DIAGNOSIS — E785 Hyperlipidemia, unspecified: Secondary | ICD-10-CM | POA: Diagnosis not present

## 2019-07-11 DIAGNOSIS — E079 Disorder of thyroid, unspecified: Secondary | ICD-10-CM | POA: Diagnosis not present

## 2019-07-11 DIAGNOSIS — E559 Vitamin D deficiency, unspecified: Secondary | ICD-10-CM | POA: Diagnosis not present

## 2019-07-11 DIAGNOSIS — R1310 Dysphagia, unspecified: Secondary | ICD-10-CM | POA: Diagnosis not present

## 2019-07-11 LAB — COMPREHENSIVE METABOLIC PANEL
Albumin: 4.3 (ref 3.5–5.0)
Calcium: 11.1 — AB (ref 8.7–10.7)
Globulin: 2.1

## 2019-07-11 LAB — BASIC METABOLIC PANEL
BUN: 20 (ref 4–21)
CO2: 23 — AB (ref 13–22)
Chloride: 103 (ref 99–108)
Creatinine: 0.9 (ref 0.5–1.1)
Glucose: 86
Potassium: 4.4 (ref 3.4–5.3)
Sodium: 138 (ref 137–147)

## 2019-07-11 LAB — TSH: TSH: 2.59 (ref 0.41–5.90)

## 2019-07-11 LAB — CBC AND DIFFERENTIAL
HCT: 45 (ref 36–46)
Hemoglobin: 15.1 (ref 12.0–16.0)
Platelets: 265 (ref 150–399)
WBC: 6.3

## 2019-07-11 LAB — LIPID PANEL
Cholesterol: 220 — AB (ref 0–200)
HDL: 72 — AB (ref 35–70)
LDL Cholesterol: 130
Triglycerides: 91 (ref 40–160)

## 2019-07-11 LAB — HEPATIC FUNCTION PANEL
ALT: 12 (ref 7–35)
AST: 20 (ref 13–35)

## 2019-07-11 LAB — CBC: RBC: 5.21 — AB (ref 3.87–5.11)

## 2019-07-26 ENCOUNTER — Encounter: Payer: Self-pay | Admitting: Internal Medicine

## 2019-08-27 ENCOUNTER — Other Ambulatory Visit: Payer: Self-pay | Admitting: Internal Medicine

## 2019-08-30 ENCOUNTER — Telehealth: Payer: Self-pay | Admitting: *Deleted

## 2019-08-30 MED ORDER — VALACYCLOVIR HCL 1 G PO TABS: ORAL_TABLET | ORAL | 0 refills | Status: DC

## 2019-08-30 NOTE — Telephone Encounter (Signed)
[facility]ergie Living Nurse, called and stated that she believes patient is showing symptoms of Shingles. Stated that patient has been under a lot of stress with her husband and she is having a deep burning, aching sensation under breast going around to back.  Patient has had a history of shingles.  Tylenol, Ice nor heat does not help.  Has been coming on a couple of days now with no rash.  Please Advise.    Pharmacy: [facility].

## 2019-08-30 NOTE — Telephone Encounter (Signed)
Katharine Look notified and agreed.  Rx faxed to pharmacy.

## 2019-08-30 NOTE — Telephone Encounter (Signed)
Let's treat her for shingles in case she erupts with a rash over the weekend.  Valacyclovir 1000mg  po q8 h x 7 days to her pharmacy please.   For pain, salonpas with lidocaine patch can be used daily over painful area as long as no open vesicles appear.   If she develops open vesicles, she should cover any draining areas to prevent spread to other residents.

## 2019-09-06 ENCOUNTER — Encounter: Payer: Self-pay | Admitting: Internal Medicine

## 2019-09-25 ENCOUNTER — Telehealth: Payer: Self-pay | Admitting: Nurse Practitioner

## 2019-09-25 NOTE — Telephone Encounter (Signed)
-----   Message from Lauree Chandler, NP sent at 09/25/2019  8:26 AM EDT ----- Make sure you document that in a telephone encounter please :) Thank you  ----- Message ----- From: Nyoka Lint Sent: 09/24/2019   4:41 PM EDT To: Lauree Chandler, NP  Patient refused to do it. She said that she didn't want to be asked to do it again.  Raquel Sarna ----- Message ----- From: Lauree Chandler, NP Sent: 09/24/2019   4:25 PM EDT To: Nyoka Lint  [facility] pt but lives in an apartment so I can do the AWV via telephone visit if she is agreeable Last AWV was 06/2017

## 2019-11-27 ENCOUNTER — Other Ambulatory Visit: Payer: Self-pay | Admitting: Internal Medicine

## 2019-12-04 DIAGNOSIS — H353131 Nonexudative age-related macular degeneration, bilateral, early dry stage: Secondary | ICD-10-CM | POA: Diagnosis not present

## 2019-12-04 DIAGNOSIS — H5213 Myopia, bilateral: Secondary | ICD-10-CM | POA: Diagnosis not present

## 2019-12-04 DIAGNOSIS — H524 Presbyopia: Secondary | ICD-10-CM | POA: Diagnosis not present

## 2019-12-04 DIAGNOSIS — Z961 Presence of intraocular lens: Secondary | ICD-10-CM | POA: Diagnosis not present

## 2020-01-22 DIAGNOSIS — M545 Low back pain, unspecified: Secondary | ICD-10-CM | POA: Diagnosis not present

## 2020-03-02 ENCOUNTER — Other Ambulatory Visit: Payer: Self-pay | Admitting: Internal Medicine

## 2020-03-02 NOTE — Telephone Encounter (Signed)
Patient has request refill on medication "Levothyroxine 50 mcg". Patient last refill was 11/27/2019. Patient is due for refill. I tried to refill medication but it has a warning. Medication pend and sent to Marlowe Sax, NP due to PCP Hollace Kinnier L, DO being out of office.

## 2020-03-04 ENCOUNTER — Other Ambulatory Visit: Payer: Self-pay | Admitting: Internal Medicine

## 2020-03-04 ENCOUNTER — Telehealth: Payer: Self-pay

## 2020-03-04 NOTE — Telephone Encounter (Signed)
Per Dr. Mariea Clonts it is ok to fill her Levothyroxine for the next 6 months . It was ordered on 03/02/2020

## 2020-03-05 ENCOUNTER — Other Ambulatory Visit: Payer: Self-pay | Admitting: Internal Medicine

## 2020-03-05 MED ORDER — LEVOTHYROXINE SODIUM 50 MCG PO TABS: ORAL_TABLET | ORAL | 3 refills | Status: DC

## 2020-03-10 DIAGNOSIS — H353133 Nonexudative age-related macular degeneration, bilateral, advanced atrophic without subfoveal involvement: Secondary | ICD-10-CM | POA: Diagnosis not present

## 2020-03-10 DIAGNOSIS — H43813 Vitreous degeneration, bilateral: Secondary | ICD-10-CM | POA: Diagnosis not present

## 2020-03-10 DIAGNOSIS — H43393 Other vitreous opacities, bilateral: Secondary | ICD-10-CM | POA: Diagnosis not present

## 2020-03-30 ENCOUNTER — Encounter: Payer: Self-pay | Admitting: Internal Medicine

## 2020-04-09 DIAGNOSIS — M5416 Radiculopathy, lumbar region: Secondary | ICD-10-CM | POA: Diagnosis not present

## 2020-04-09 DIAGNOSIS — R030 Elevated blood-pressure reading, without diagnosis of hypertension: Secondary | ICD-10-CM | POA: Diagnosis not present

## 2020-04-15 ENCOUNTER — Other Ambulatory Visit: Payer: Self-pay | Admitting: Neurosurgery

## 2020-04-15 DIAGNOSIS — M5416 Radiculopathy, lumbar region: Secondary | ICD-10-CM

## 2020-04-23 ENCOUNTER — Ambulatory Visit
Admission: RE | Admit: 2020-04-23 | Discharge: 2020-04-23 | Disposition: A | Discharge status: Home / Self Care | Payer: Medicare Other | Source: Ambulatory Visit | Attending: Neurosurgery | Admitting: Neurosurgery

## 2020-04-23 DIAGNOSIS — M5416 Radiculopathy, lumbar region: Secondary | ICD-10-CM

## 2020-04-23 DIAGNOSIS — M48061 Spinal stenosis, lumbar region without neurogenic claudication: Secondary | ICD-10-CM | POA: Diagnosis not present

## 2020-04-23 DIAGNOSIS — M545 Low back pain, unspecified: Secondary | ICD-10-CM | POA: Diagnosis not present

## 2020-05-07 DIAGNOSIS — M5416 Radiculopathy, lumbar region: Secondary | ICD-10-CM | POA: Diagnosis not present

## 2020-05-20 DIAGNOSIS — R030 Elevated blood-pressure reading, without diagnosis of hypertension: Secondary | ICD-10-CM | POA: Diagnosis not present

## 2020-05-20 DIAGNOSIS — M5416 Radiculopathy, lumbar region: Secondary | ICD-10-CM | POA: Diagnosis not present

## 2020-05-22 ENCOUNTER — Other Ambulatory Visit: Payer: Self-pay | Admitting: Adult Health

## 2020-05-22 MED ORDER — AMLODIPINE BESYLATE 10 MG PO TABS: 10.0000 mg | ORAL_TABLET | Freq: Every day | ORAL | 0 refills | Status: DC

## 2020-05-22 NOTE — Progress Notes (Signed)
Nurse called in stating that BPs has been elevated. Today, BP 200/70. Ordered to start Amlodipine 10 mg daily, monitor BPs and to follow up on Monday, 05/25/20.

## 2020-06-01 ENCOUNTER — Other Ambulatory Visit: Payer: Self-pay

## 2020-06-01 ENCOUNTER — Non-Acute Institutional Stay: Payer: Medicare Other | Admitting: Adult Health

## 2020-06-01 ENCOUNTER — Encounter: Payer: Self-pay | Admitting: Adult Health

## 2020-06-01 VITALS — BP 148/66 | HR 64 | Temp 96.3°F | Ht 63.0 in | Wt 104.4 lb

## 2020-06-01 DIAGNOSIS — I10 Essential (primary) hypertension: Secondary | ICD-10-CM | POA: Diagnosis not present

## 2020-06-01 DIAGNOSIS — E039 Hypothyroidism, unspecified: Secondary | ICD-10-CM | POA: Diagnosis not present

## 2020-06-01 MED ORDER — AMLODIPINE BESYLATE 10 MG PO TABS: 10.0000 mg | ORAL_TABLET | Freq: Every day | ORAL | 11 refills | Status: DC

## 2020-06-01 NOTE — Patient Instructions (Signed)
Your blood pressure is well controlled on your current regimen  F/U back with Dr. Lyndel Safe in June with labs before so we can go over them in the apt

## 2020-06-01 NOTE — Progress Notes (Signed)
Location:  [place]: Clinic  Provider:  Cindi Carbon, [place] [phone]   Code Status: DNR Goals of Care:  Advanced Directives 07/10/2019  Does Patient Have a Medical Advance Directive? Yes  Type of Advance Directive [place]  Does patient want to make changes to medical advance directive? No - Patient declined  Copy of [place] in Chart? Yes - validated most recent copy scanned in chart (See row information)  Pre-existing out of facility DNR order (yellow form or pink MOST form) -     Chief Complaint  Patient presents with  . Acute Visit    Patient returns to the clinic due to elevated blood pressures.    HPI: Patient is a 87 y.o. female seen for acute visit for HTN  Ms Bricco reports that her BP had been running high when she would go to Dr. Irven [place] office for her back pain. She received a steroid injection and this helped the pain but her BP stayed elevated Our oncall provider called in Norvasc 10 mg on 4/15 and now she is here for f/u and a refill BP 148 66  She denies any dizziness, edema, sob, cp etc  Continues to play bridge and is quite independent. Reports grieving the loss of her husband who died a few months ago.  Feels like her life has changed a lot. Reports that she was married to a man for 60 years with bipolar and she will never takes meds for mood.    Past Medical History:  Diagnosis Date  . Actinic keratosis   . Aftercare following surgery of the skin or subcutaneous tissue   . Basal cell carcinoma of skin of other parts of face   . Bone/cartilage disorder    nose  . Neoplasm of uncertain behavior of skin   . Screening for malignant neoplasm of the rectum   . Thyroid disease     Past Surgical History:  Procedure Laterality Date  . APPENDECTOMY  1939   Dr. Charisse March  . BACK SURGERY  2005   Dr. Micheal Likens  . Cancer Removal Forehead, Basal Cell  N/A 11/24/2009  . colonoscopy performation  2000   Dr. Maurene Capes, Dr.Weatherly  . ESOPHAGOGASTRODUODENOSCOPY (EGD) WITH PROPOFOL N/A 06/06/2018   Procedure: ESOPHAGOGASTRODUODENOSCOPY (EGD) WITH PROPOFOL;  Surgeon: Milus Banister, MD;  Location: [facility];  Service: Endoscopy;  Laterality: N/A;  . FOREIGN BODY REMOVAL  06/06/2018   Procedure: FOREIGN BODY REMOVAL;  Surgeon: Milus Banister, MD;  Location: [facility];  Service: Endoscopy;;    Allergies  Allergen Reactions  . Polysporin [Bacitracin-Polymyxin B] Anaphylaxis and Rash  . Sulfonamide Derivatives Anaphylaxis and Rash    REACTION: hive    Outpatient Encounter Medications as of 06/01/2020  Medication Sig  . cholecalciferol (VITAMIN D) 1000 units tablet Take 2,000 Units by mouth daily.  Marland Kitchen levothyroxine (SYNTHROID) 50 MCG tablet TAKE 1 TABLET BY MOUTH ONCE DAILY BEFORE BREAKFAST  . Multiple Vitamins-Minerals (PRESERVISION AREDS 2) CAPS Take 2 capsules by mouth daily.  . Omega-3 Fatty Acids (FISH OIL) 1000 MG CAPS Take 1 capsule by mouth daily.  . [DISCONTINUED] amLODipine (NORVASC) 10 MG tablet Take 1 tablet (10 mg total) by mouth daily.  Marland Kitchen amLODipine (NORVASC) 10 MG tablet Take 1 tablet (10 mg total) by mouth daily.  . [DISCONTINUED] valACYclovir (VALTREX) 1000 MG tablet Take one tablet by mouth every 8 hours for 7 days   No  facility-administered encounter medications on file as of 06/01/2020.    Review of Systems:  Review of Systems  Constitutional: Negative for activity change, appetite change, chills, diaphoresis, fatigue, fever and unexpected weight change.  HENT: Negative for congestion.   Respiratory: Negative for cough, shortness of breath and wheezing.   Cardiovascular: Negative for chest pain, palpitations and leg swelling.  Gastrointestinal: Negative for abdominal distention, abdominal pain, constipation and diarrhea.  Genitourinary: Negative for difficulty urinating and dysuria.  Musculoskeletal: Positive for  back pain (improved). Negative for arthralgias, gait problem, joint swelling and myalgias.  Neurological: Negative for dizziness, tremors, seizures, syncope, facial asymmetry, speech difficulty, weakness, light-headedness, numbness and headaches.  Psychiatric/Behavioral: Positive for dysphoric mood (due to grieving ). Negative for agitation, behavioral problems and confusion.    Health Maintenance  Topic Date Due  . DEXA SCAN  07/09/2020 (Originally 05/21/1993)  . INFLUENZA VACCINE  09/07/2020  . TETANUS/TDAP  10/08/2028  . COVID-19 Vaccine  Completed  . PNA vac Low Risk Adult  Completed  . HPV VACCINES  Aged Out    Physical Exam: Vitals:   06/01/20 1434  BP: (!) 148/66  Pulse: 64  Temp: (!) 96.3 F (35.7 C)  SpO2: 100%  Weight: 104 lb 6.4 oz (47.4 kg)  Height: 5\' 3"  (1.6 m)   Body mass index is 18.49 kg/m. Physical Exam Vitals and nursing note reviewed.  Constitutional:      General: She is not in acute distress.    Appearance: She is not diaphoretic.  HENT:     Head: Normocephalic and atraumatic.  Neck:     Vascular: No JVD.  Cardiovascular:     Rate and Rhythm: Normal rate and regular rhythm.     Heart sounds: No murmur heard.   Pulmonary:     Effort: Pulmonary effort is normal. No respiratory distress.     Breath sounds: Normal breath sounds. No wheezing.  Abdominal:     General: Bowel sounds are normal. There is no distension.     Palpations: Abdomen is soft.     Tenderness: There is no abdominal tenderness.  Musculoskeletal:     Right lower leg: No edema.     Left lower leg: No edema.  Skin:    General: Skin is warm and dry.  Neurological:     Mental Status: She is alert and oriented to person, place, and time.  Psychiatric:        Mood and Affect: Mood normal.     Labs reviewed: Basic Metabolic Panel: Recent Labs    07/11/19 0300  NA 138  K 4.4  CL 103  CO2 23*  BUN 20  CREATININE 0.9  CALCIUM 11.1*  TSH 2.59   Liver Function  Tests: Recent Labs    07/11/19 0300  AST 20  ALT 12  ALBUMIN 4.3   No results for input(s): LIPASE, AMYLASE in the last 8760 hours. No results for input(s): AMMONIA in the last 8760 hours. CBC: Recent Labs    07/11/19 0000  WBC 6.3  HGB 15.1  HCT 45  PLT 265   Lipid Panel: Recent Labs    07/11/19 0300  CHOL 220*  HDL 72*  LDLCALC 130  TRIG 91   No results found for: HGBA1C  Procedures since last visit: No results found.  Assessment/Plan  1. Essential hypertension Improved BP Continue Norvasc 10 mg qd F/U with Dr Lyndel Safe for labs   2. Acquired hypothyroidism Continue Synthroid 50 mcg qd  Check TSH at next  visit   Labs/tests ordered:  TSH Lipid CMP CBC Next appt:  07/08/2020

## 2020-06-19 ENCOUNTER — Telehealth: Payer: Self-pay | Admitting: Adult Health

## 2020-06-19 MED ORDER — LOSARTAN POTASSIUM 25 MG PO TABS: 25.0000 mg | ORAL_TABLET | Freq: Every day | ORAL | 2 refills | Status: DC

## 2020-06-19 MED ORDER — AMLODIPINE BESYLATE 10 MG PO TABS: 10.0000 mg | ORAL_TABLET | Freq: Every day | ORAL | 5 refills | Status: DC

## 2020-06-19 NOTE — Telephone Encounter (Signed)
Nurse in Cheryl Holmes reported that Cheryl Holmes bp is still elevated 178/60.  She was recently started on Norvasc. Will add losartan 25 mg qd and follow up in the clinic

## 2020-06-30 DIAGNOSIS — E785 Hyperlipidemia, unspecified: Secondary | ICD-10-CM | POA: Diagnosis not present

## 2020-06-30 DIAGNOSIS — I10 Essential (primary) hypertension: Secondary | ICD-10-CM | POA: Diagnosis not present

## 2020-06-30 DIAGNOSIS — E559 Vitamin D deficiency, unspecified: Secondary | ICD-10-CM | POA: Diagnosis not present

## 2020-06-30 DIAGNOSIS — Z13220 Encounter for screening for lipoid disorders: Secondary | ICD-10-CM | POA: Diagnosis not present

## 2020-06-30 DIAGNOSIS — E039 Hypothyroidism, unspecified: Secondary | ICD-10-CM | POA: Diagnosis not present

## 2020-06-30 DIAGNOSIS — E079 Disorder of thyroid, unspecified: Secondary | ICD-10-CM | POA: Diagnosis not present

## 2020-06-30 LAB — BASIC METABOLIC PANEL
BUN: 9 (ref 4–21)
Creatinine: 0.7 (ref 0.5–1.1)
Glucose: 96
Potassium: 4.7 (ref 3.4–5.3)
Sodium: 141 (ref 137–147)

## 2020-06-30 LAB — LIPID PANEL
Cholesterol: 217 — AB (ref 0–200)
HDL: 65 (ref 35–70)
LDL Cholesterol: 127
LDl/HDL Ratio: 3.3
Triglycerides: 126 (ref 40–160)

## 2020-06-30 LAB — CBC AND DIFFERENTIAL
HCT: 43 (ref 36–46)
Hemoglobin: 14.2 (ref 12.0–16.0)
Platelets: 397 (ref 150–399)
WBC: 7.5

## 2020-06-30 LAB — COMPREHENSIVE METABOLIC PANEL: Calcium: 10.5 (ref 8.7–10.7)

## 2020-06-30 LAB — CBC: RBC: 4.83 (ref 3.87–5.11)

## 2020-06-30 LAB — TSH: TSH: 2.4 (ref 0.41–5.90)

## 2020-07-07 ENCOUNTER — Ambulatory Visit: Payer: Medicare Other | Attending: Internal Medicine

## 2020-07-07 ENCOUNTER — Other Ambulatory Visit: Payer: Self-pay

## 2020-07-07 ENCOUNTER — Other Ambulatory Visit (HOSPITAL_BASED_OUTPATIENT_CLINIC_OR_DEPARTMENT_OTHER): Payer: Self-pay

## 2020-07-07 DIAGNOSIS — Z23 Encounter for immunization: Secondary | ICD-10-CM

## 2020-07-07 MED ORDER — COVID-19 MRNA VACC (MODERNA) 100 MCG/0.5ML IM SUSP
INTRAMUSCULAR | 0 refills | Status: DC
  Filled 2020-07-07: qty 0.25, 1d supply, fill #0

## 2020-07-07 NOTE — Progress Notes (Signed)
   Covid-19 Vaccination Clinic  Name:  LAAIBAH WARTMAN    MRN: [id number] DOB: [birth year]  07/07/2020  Ms. Culton was observed post Covid-19 immunization for 15 minutes without incident. She was provided with Vaccine Information Sheet and instruction to access the V-Safe system.   Ms. Bolin was instructed to call 911 with any severe reactions post vaccine: Marland Kitchen Difficulty breathing  . Swelling of face and throat  . A fast heartbeat  . A bad rash all over body  . Dizziness and weakness   Immunizations Administered    Name Date Dose VIS Date Route   Moderna Covid-19 Booster Vaccine 07/07/2020 11:11 AM 0.25 mL 11/27/2019 Intramuscular   Manufacturer: Moderna   Lot: [id number]   [place]: [phone]

## 2020-07-08 ENCOUNTER — Non-Acute Institutional Stay: Payer: Medicare Other | Admitting: Internal Medicine

## 2020-07-08 ENCOUNTER — Encounter: Payer: Self-pay | Admitting: Internal Medicine

## 2020-07-08 ENCOUNTER — Other Ambulatory Visit: Payer: Self-pay

## 2020-07-08 VITALS — BP 156/70 | HR 86 | Temp 96.8°F | Ht 63.0 in | Wt 103.0 lb

## 2020-07-08 DIAGNOSIS — E039 Hypothyroidism, unspecified: Secondary | ICD-10-CM | POA: Diagnosis not present

## 2020-07-08 DIAGNOSIS — I10 Essential (primary) hypertension: Secondary | ICD-10-CM | POA: Diagnosis not present

## 2020-07-08 DIAGNOSIS — R1319 Other dysphagia: Secondary | ICD-10-CM | POA: Diagnosis not present

## 2020-07-08 DIAGNOSIS — H353130 Nonexudative age-related macular degeneration, bilateral, stage unspecified: Secondary | ICD-10-CM | POA: Diagnosis not present

## 2020-07-08 MED ORDER — LOSARTAN POTASSIUM 25 MG PO TABS: 25.0000 mg | ORAL_TABLET | Freq: Two times a day (BID) | ORAL | 2 refills | Status: DC

## 2020-07-08 NOTE — Progress Notes (Signed)
Location:  [place] of Service:  Clinic (12)  Provider:   Code Status: DNR Goals of Care:  Advanced Directives 07/10/2019  Does Patient Have a Medical Advance Directive? Yes  Type of Advance Directive [place]  Does patient want to make changes to medical advance directive? No - Patient declined  Copy of [place] in Chart? Yes - validated most recent copy scanned in chart (See row information)  Pre-existing out of facility DNR order (yellow form or pink MOST form) -     Chief Complaint  Patient presents with  . Medical Management of Chronic Issues    Patient returns to the clinic for her 1 year follow up    HPI: Patient is a 87 y.o. female seen today for medical management of chronic diseases.    She has h/o  Hypertension  Recent worsening  Very concerned about it Says it runs 155/90 by facility nurse Dysphagia Feels full very quickly Food seems stuck in her Chest and Lower Abdomen H/o Esophageal Stricture and Food Impaction before in 2020 Weight Stable Recent loss of Spouse Has 4 Children in town Past Medical History:  Diagnosis Date  . Actinic keratosis   . Aftercare following surgery of the skin or subcutaneous tissue   . Basal cell carcinoma of skin of other parts of face   . Bone/cartilage disorder    nose  . Neoplasm of uncertain behavior of skin   . Screening for malignant neoplasm of the rectum   . Thyroid disease     Past Surgical History:  Procedure Laterality Date  . APPENDECTOMY  1939   Dr. Charisse March  . BACK SURGERY  2005   Dr. Micheal Likens  . Cancer Removal Forehead, Basal Cell N/A 11/24/2009  . colonoscopy performation  2000   Dr. Maurene Capes, Dr.Weatherly  . ESOPHAGOGASTRODUODENOSCOPY (EGD) WITH PROPOFOL N/A 06/06/2018   Procedure: ESOPHAGOGASTRODUODENOSCOPY (EGD) WITH PROPOFOL;  Surgeon: Milus Banister, MD;  [facility];  Service: Endoscopy;  Laterality: N/A;   . FOREIGN BODY REMOVAL  06/06/2018   Procedure: FOREIGN BODY REMOVAL;  Surgeon: Milus Banister, MD;  Location: [facility];  Service: Endoscopy;;    Allergies  Allergen Reactions  . Polysporin [Bacitracin-Polymyxin B] Anaphylaxis and Rash  . Sulfonamide Derivatives Anaphylaxis and Rash    REACTION: hive    Outpatient Encounter Medications as of 07/08/2020  Medication Sig  . amLODipine (NORVASC) 10 MG tablet Take 1 tablet (10 mg total) by mouth daily.  . cholecalciferol (VITAMIN D) 1000 units tablet Take 2,000 Units by mouth daily.  Marland Kitchen COVID-19 mRNA vaccine, Moderna, 100 MCG/0.5ML injection Inject into the muscle.  . levothyroxine (SYNTHROID) 50 MCG tablet TAKE 1 TABLET BY MOUTH ONCE DAILY BEFORE BREAKFAST  . losartan (COZAAR) 25 MG tablet Take 1 tablet (25 mg total) by mouth daily.  . Multiple Vitamins-Minerals (PRESERVISION AREDS 2) CAPS Take 2 capsules by mouth daily.  . Omega-3 Fatty Acids (FISH OIL) 1000 MG CAPS Take 1 capsule by mouth daily.   No facility-administered encounter medications on file as of 07/08/2020.    Review of Systems:  Review of Systems  Review of Systems  Constitutional: Negative for activity change, appetite change, chills, diaphoresis, fatigue and fever.  HENT: Negative for mouth sores, postnasal drip, rhinorrhea, sinus pain and sore throat.   Respiratory: Negative for apnea, cough, chest tightness, shortness of breath and wheezing.   Cardiovascular: Negative for chest pain, palpitations and leg  swelling.  Gastrointestinal: Negative for abdominal distention, abdominal pain, constipation, diarrhea, nausea and vomiting.  Genitourinary: Negative for dysuria and frequency.  Musculoskeletal: Negative for arthralgias, joint swelling and myalgias.  Skin: Negative for rash.  Neurological: Negative for dizziness, syncope, weakness, light-headedness and numbness.  Psychiatric/Behavioral: Negative for behavioral problems, confusion and sleep disturbance.      Health Maintenance  Topic Date Due  . DEXA SCAN  07/09/2020 (Originally 05/21/1993)  . INFLUENZA VACCINE  09/07/2020  . TETANUS/TDAP  10/08/2028  . COVID-19 Vaccine  Completed  . PNA vac Low Risk Adult  Completed  . Zoster Vaccines- Shingrix  Completed  . HPV VACCINES  Aged Out    Physical Exam: Vitals:   07/08/20 1040  BP: (!) 156/70  Pulse: 86  Temp: (!) 96.8 F (36 C)  SpO2: 99%  Weight: 103 lb (46.7 kg)  Height: 5\' 3"  (1.6 m)   Body mass index is 18.25 kg/m. Physical Exam  Constitutional: Oriented to person, place, and time. Well-developed .  HENT:  Head: Normocephalic.  Mouth/Throat: Oropharynx is clear and moist.  TM good with no wax Eyes: Pupils are equal, round, and reactive to light.  Neck: Neck supple.  Cardiovascular: Normal rate and normal heart sounds.  No murmur heard. Pulmonary/Chest: Effort normal and breath sounds normal. No respiratory distress. No wheezes. She has no rales.  Abdominal: Soft. Bowel sounds are normal. No distension. There is no tenderness. There is no rebound.  Musculoskeletal: No edema.  Lymphadenopathy: none Neurological: Alert and oriented to person, place, and time.  Skin: Skin is warm and dry.  Psychiatric: Normal mood and affect. Behavior is normal. Thought content normal.    Labs reviewed: Basic Metabolic Panel: Recent Labs    07/11/19 0300 06/30/20 0000  NA 138 141  K 4.4 4.7  CL 103  --   CO2 23*  --   BUN 20 9  CREATININE 0.9 0.7  CALCIUM 11.1* 10.5  TSH 2.59  --    Liver Function Tests: Recent Labs    07/11/19 0300  AST 20  ALT 12  ALBUMIN 4.3   No results for input(s): LIPASE, AMYLASE in the last 8760 hours. No results for input(s): AMMONIA in the last 8760 hours. CBC: Recent Labs    07/11/19 0000  WBC 6.3  HGB 15.1  HCT 45  PLT 265   Lipid Panel: Recent Labs    07/11/19 0300  CHOL 220*  HDL 72*  LDLCALC 130  TRIG 91   No results found for: HGBA1C  Procedures since last  visit: No results found.  Assessment/Plan 1. Essential hypertension Change Cozaar to BID will call New Prescription She will continue to get it checked by Nurse q weekly and let us know if it runs high On Norvasc No LE edema  2. Acquired hypothyroidism TSH normal  3. DYSPHAGIA She has Finally agreed for GI consult Eats Slowly and Soft Diet Weight is stable Refusing to take Antireflux med - Ambulatory referral to Gastroenterology  4. Bilateral nonexudative age-related macular degeneration, unspecified stage   5 Loss oF Spouse Seems to be managing 6 Hyperlipidemia Refuses Statin LDL 124 Does not want Bone density Labs/tests ordered:  * No order type specified * Next appt:  Visit date not found

## 2020-07-15 ENCOUNTER — Encounter: Payer: Self-pay | Admitting: Internal Medicine

## 2020-08-03 ENCOUNTER — Ambulatory Visit (INDEPENDENT_AMBULATORY_CARE_PROVIDER_SITE_OTHER): Payer: Medicare Other | Admitting: Internal Medicine

## 2020-08-03 ENCOUNTER — Encounter: Payer: Self-pay | Admitting: Internal Medicine

## 2020-08-03 VITALS — BP 118/48 | HR 60 | Ht 63.0 in | Wt 101.8 lb

## 2020-08-03 DIAGNOSIS — R634 Abnormal weight loss: Secondary | ICD-10-CM

## 2020-08-03 DIAGNOSIS — K219 Gastro-esophageal reflux disease without esophagitis: Secondary | ICD-10-CM

## 2020-08-03 DIAGNOSIS — R1319 Other dysphagia: Secondary | ICD-10-CM

## 2020-08-03 DIAGNOSIS — K222 Esophageal obstruction: Secondary | ICD-10-CM

## 2020-08-03 NOTE — Patient Instructions (Signed)
If you are age 85 or older, your body mass index should be between 23-30. Your Body mass index is 18.03 kg/m. If this is out of the aforementioned range listed, please consider follow up with your Primary Care Provider.  If you are age 47 or younger, your body mass index should be between 19-25. Your Body mass index is 18.03 kg/m. If this is out of the aformentioned range listed, please consider follow up with your Primary Care Provider.   __________________________________________________________  The  GI providers would like to encourage you to use [facility] to communicate with providers for non-urgent requests or questions.  Due to long hold times on the telephone, sending your provider a message by [facility] may be a faster and more efficient way to get a response.  Please allow 48 business hours for a response.  Please remember that this is for non-urgent requests.   You have been scheduled for an endoscopy. Please follow written instructions given to you at your visit today. If you use inhalers (even only as needed), please bring them with you on the day of your procedure.

## 2020-08-04 ENCOUNTER — Encounter: Payer: Self-pay | Admitting: Internal Medicine

## 2020-08-04 NOTE — Progress Notes (Signed)
HISTORY OF PRESENT ILLNESS:  Cheryl Holmes is a delightful 87 y.o. female, [facility] resident and mother of Emelia Loron, who is urgently worked onto today's office schedule at the request of Dr. Lyla Son regarding problems with dysphagia.  The patient has a history of proximal esophageal stricture at the site of a gastric inlet patch on endoscopy by Dr. Olevia Perches in 2009.  In April 2020 the patient presented with an acute food impaction for which she underwent upper endoscopy with Dr. Owens Loffler.  She was found to have a medium sized food bolus in the proximal esophagus which was gently advanced into the stomach.  There was a mild Schatzki's ring at the gastroesophageal junction.  There was a 2 cm typical appearing gastric inlet patch in the proximal esophagus approximately 15 Cm from the incisors with a mild associated benign stricture.  This was the site of the food impaction.  Stomach was normal.  She was started on omeprazole and told to follow-up in the office.  Does not appear that she had office follow-up.  She tells me that over the past several months she has had progressive intermittent solid food dysphagia to the point where she avoids all foods of substance.  This change in diet has caused weight loss.  She is not taking regular PPI.  She does have some reflux symptoms.  No other significant complaints.  No relevant laboratory or x-ray studies to report  REVIEW OF SYSTEMS:  All non-GI ROS negative unless otherwise stated in the HPI except for visual change  Past Medical History:  Diagnosis Date   Actinic keratosis    Aftercare following surgery of the skin or subcutaneous tissue    Basal cell carcinoma of skin of other parts of face    Bone/cartilage disorder    nose   Neoplasm of uncertain behavior of skin    Screening for malignant neoplasm of the rectum    Thyroid disease     Past Surgical History:  Procedure Laterality Date   APPENDECTOMY  1939   Dr. Griffin [facility]    BACK SURGERY  2005   Dr. Micheal Likens   Cancer Removal Forehead, Basal Cell N/A 11/24/2009   colonoscopy performation  2000   Dr. Maurene Capes, Dr.Weatherly   ESOPHAGOGASTRODUODENOSCOPY (EGD) WITH PROPOFOL N/A 06/06/2018   Procedure: ESOPHAGOGASTRODUODENOSCOPY (EGD) WITH PROPOFOL;  Surgeon: Milus Banister, MD;  [facility];  Service: Endoscopy;  Laterality: N/A;   FOREIGN BODY REMOVAL  06/06/2018   Procedure: FOREIGN BODY REMOVAL;  Surgeon: Milus Banister, MD;  Location: [facility];  Service: Endoscopy;;    Social History Carneshia C Loughney  reports that she has quit smoking. Her smoking use included cigarettes. She has never used smokeless tobacco. She reports current alcohol use. She reports that she does not use drugs.  family history includes Heart disease in her father; Kidney disease in her mother.  Allergies  Allergen Reactions   Polysporin [Bacitracin-Polymyxin B] Anaphylaxis and Rash   Sulfonamide Derivatives Anaphylaxis and Rash    REACTION: hive       PHYSICAL EXAMINATION: Vital signs: BP (!) 118/48   Pulse 60   Ht 5\' 3"  (1.6 m)   Wt 101 lb 12.8 oz (46.2 kg)   BMI 18.03 kg/m   Constitutional: generally well-appearing, no acute distress Psychiatric: alert and oriented x3, cooperative Eyes: extraocular movements intact, anicteric, conjunctiva pink Mouth: oral pharynx moist, no lesions Neck: supple no lymphadenopathy Cardiovascular: heart regular rate and rhythm, no murmur  Lungs: clear to auscultation bilaterally Abdomen: soft, nontender, nondistended, no obvious ascites, no peritoneal signs, normal bowel sounds, no organomegaly Rectal: Omitted Extremities: no clubbing, cyanosis, or lower extremity edema bilaterally Skin: no lesions on visible extremities Neuro: No focal deficits.  Cranial nerves intact  ASSESSMENT:  1.  Worsening intermittent solid food dysphagia likely secondary to known proximal esophageal stricture, and region of inlet patch.  I  suspect that this is acid producing. 2.  Weight loss.  Secondary to decreased oral intake 3 GERD.  Untreated 4.  Advanced age   PLAN:  1.  Schedule upper endoscopy with esophageal dilation.  Patient is high risk given her age.The nature of the procedure, as well as the risks, benefits, and alternatives were carefully and thoroughly reviewed with the patient. Ample time for discussion and questions allowed. The patient understood, was satisfied, and agreed to proceed.  2.  Suspect she may benefit from PPI post dilation.  We will hold off at this point as she is having some difficulty with pills

## 2020-08-18 ENCOUNTER — Ambulatory Visit: Payer: Medicare Other | Admitting: Physician Assistant

## 2020-08-19 ENCOUNTER — Ambulatory Visit (AMBULATORY_SURGERY_CENTER): Payer: Medicare Other | Admitting: Internal Medicine

## 2020-08-19 ENCOUNTER — Encounter: Payer: Self-pay | Admitting: Internal Medicine

## 2020-08-19 ENCOUNTER — Other Ambulatory Visit: Payer: Self-pay

## 2020-08-19 VITALS — BP 117/50 | HR 63 | Temp 96.9°F | Resp 19 | Ht 63.0 in | Wt 101.0 lb

## 2020-08-19 DIAGNOSIS — R1319 Other dysphagia: Secondary | ICD-10-CM | POA: Diagnosis not present

## 2020-08-19 DIAGNOSIS — K222 Esophageal obstruction: Secondary | ICD-10-CM

## 2020-08-19 DIAGNOSIS — K2289 Other specified disease of esophagus: Secondary | ICD-10-CM

## 2020-08-19 DIAGNOSIS — K219 Gastro-esophageal reflux disease without esophagitis: Secondary | ICD-10-CM | POA: Diagnosis not present

## 2020-08-19 DIAGNOSIS — C159 Malignant neoplasm of esophagus, unspecified: Secondary | ICD-10-CM | POA: Diagnosis not present

## 2020-08-19 DIAGNOSIS — C155 Malignant neoplasm of lower third of esophagus: Secondary | ICD-10-CM | POA: Diagnosis not present

## 2020-08-19 MED ORDER — SODIUM CHLORIDE 0.9 % IV SOLN: 500.0000 mL | Freq: Once | INTRAVENOUS | Status: DC

## 2020-08-19 NOTE — Progress Notes (Signed)
Called to room to assist during endoscopic procedure.  Patient ID and intended procedure confirmed with present staff. Received instructions for my participation in the procedure from the performing physician. Specimen sent rush per Dr. Blanch Media request

## 2020-08-19 NOTE — Patient Instructions (Signed)
Await pathology results.  Liquid diet and very soft foods to avoid obstruction.  Continue present medications.  Schedule CT scan of chest, abdomen and pelvis.  You will be called to schedule this.  YOU HAD AN ENDOSCOPIC PROCEDURE TODAY AT [facility]:   Refer to the procedure report that was given to you for any specific questions about what was found during the examination.  If the procedure report does not answer your questions, please call your gastroenterologist to clarify.  If you requested that your care partner not be given the details of your procedure findings, then the procedure report has been included in a sealed envelope for you to review at your convenience later.  YOU SHOULD EXPECT: Some feelings of bloating in the abdomen. Passage of more gas than usual.  Walking can help get rid of the air that was put into your GI tract during the procedure and reduce the bloating. If you had a lower endoscopy (such as a colonoscopy or flexible sigmoidoscopy) you may notice spotting of blood in your stool or on the toilet paper. If you underwent a bowel prep for your procedure, you may not have a normal bowel movement for a few days.  Please Note:  You might notice some irritation and congestion in your nose or some drainage.  This is from the oxygen used during your procedure.  There is no need for concern and it should clear up in a day or so.  SYMPTOMS TO REPORT IMMEDIATELY:   Following upper endoscopy (EGD)  Vomiting of blood or coffee ground material  New chest pain or pain under the shoulder blades  Painful or persistently difficult swallowing  New shortness of breath  Fever of 100F or higher  Black, tarry-looking stools  For urgent or emergent issues, a gastroenterologist can be reached at any hour by calling [phone]. Do not use MyChart messaging for urgent concerns.    DIET:  We do recommend a small meal at first, but then you may proceed to your regular  diet.  Drink plenty of fluids but you should avoid alcoholic beverages for 24 hours.  ACTIVITY:  You should plan to take it easy for the rest of today and you should NOT DRIVE or use heavy machinery until tomorrow (because of the sedation medicines used during the test).    FOLLOW UP: Our staff will call the number listed on your records 48-72 hours following your procedure to check on you and address any questions or concerns that you may have regarding the information given to you following your procedure. If we do not reach you, we will leave a message.  We will attempt to reach you two times.  During this call, we will ask if you have developed any symptoms of COVID 19. If you develop any symptoms (ie: fever, flu-like symptoms, shortness of breath, cough etc.) before then, please call [phone].  If you test positive for Covid 19 in the 2 weeks post procedure, please call and report this information to [place].    If any biopsies were taken you will be contacted by phone or by letter within the next 1-3 weeks.  Please call us at [phone] if you have not heard about the biopsies in 3 weeks.    SIGNATURES/CONFIDENTIALITY: You and/or your care partner have signed paperwork which will be entered into your electronic medical record.  These signatures attest to the fact that that the information above on your After Visit Summary has  been reviewed and is understood.  Full responsibility of the confidentiality of this discharge information lies with you and/or your care-partner.  

## 2020-08-19 NOTE — Progress Notes (Signed)
Pt's states no medical or surgical changes since previsit or office visit. 

## 2020-08-19 NOTE — Progress Notes (Signed)
pt tolerated well. VSS. awake and to recovery. Report given to RN.  Bite block left insitu to recovery. Suctioned for scant fluid. No trauma noted.

## 2020-08-19 NOTE — Op Note (Signed)
[facility] Patient Name: Cheryl Holmes Procedure Date: 08/19/2020 11:56 AM MRN: [id number] Endoscopist: Docia Chuck. [name] , MD Age: 87 Referring MD:  Date of Birth: 22-May-1926 Gender: Female Account #: [id number] Procedure:                Upper GI endoscopy with biopsies Indications:              Dysphagia, Weight loss Medicines:                Monitored Anesthesia Care Procedure:                Pre-Anesthesia Assessment:                           - Prior to the procedure, a History and Physical                            was performed, and patient medications and                            allergies were reviewed. The patient's tolerance of                            previous anesthesia was also reviewed. The risks                            and benefits of the procedure and the sedation                            options and risks were discussed with the patient.                            All questions were answered, and informed consent                            was obtained. Prior Anticoagulants: The patient has                            taken no previous anticoagulant or antiplatelet                            agents. ASA Grade Assessment: II - A patient with                            mild systemic disease. After reviewing the risks                            and benefits, the patient was deemed in                            satisfactory condition to undergo the procedure.                           After obtaining informed consent, the endoscope was  passed under direct vision. Throughout the                            procedure, the patient's blood pressure, pulse, and                            oxygen saturations were monitored continuously. The                            Endoscope was introduced through the mouth, and                            advanced to the second part of duodenum. The upper                            GI endoscopy  was accomplished without difficulty.                            The patient tolerated the procedure well. 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 There was                            an inlet patch as previously noted.                           A large, ulcerating mass with bleeding was found in                            the lower third of the esophagus. The mass was near                            obstructing and circumferential. This measured                            approximately 4 cm in length and was located                            several centimeters above the gastroesophageal                            junction. Biopsies were taken with a cold forceps                            for histology.                           The stomach was normal, save sliding hiatal hernia.  The examined duodenum was normal.                           The cardia and gastric fundus were normal on                            retroflexion. Complications:            No immediate complications. Estimated Blood Loss:     Estimated blood loss: none. Impression:               1. 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 Proximal esophageal web.                           3. Otherwise unremarkable EGD Recommendation:           1. Patient has a contact number available for                            emergencies. The signs and symptoms of potential                            delayed complications were discussed with the                            patient. Return to normal activities tomorrow.                            Written discharge instructions were provided to the                            patient.                           2. Liquid diet and very soft foods in  small                            portions to avoid obstruction.                           3. Continue present medications.                           4. Await pathology results. We will contact you as                            soon as the results are available.                           5. Schedule CT scan of the chest, abdomen, and  pelvis "esophageal cancer, rule out metastatic                            disease"                           6. Refer to GI oncology nurse navigator "esophageal                            cancer" Docia Chuck. [name], MD 08/19/2020 12:26:19 PM This report has been signed electronically.

## 2020-08-19 NOTE — Progress Notes (Signed)
V.V. vital signs.

## 2020-08-20 ENCOUNTER — Other Ambulatory Visit: Payer: Self-pay

## 2020-08-20 ENCOUNTER — Telehealth: Payer: Self-pay

## 2020-08-20 DIAGNOSIS — C159 Malignant neoplasm of esophagus, unspecified: Secondary | ICD-10-CM

## 2020-08-20 NOTE — Progress Notes (Signed)
We received an inbasket message referral from Dr [name] for this patient.  I have schedule her with Dr Lorenso Courier 08/26/2020 0900.  This was our first available new pt appt.  I spoke with Ms Alarie and she is aware of the appt.  I gave her my direct phone number.

## 2020-08-20 NOTE — Telephone Encounter (Signed)
CT of CAP ordered and sent to rad scheduling. Referral note sent to [facility] RN Navigator.

## 2020-08-21 ENCOUNTER — Telehealth: Payer: Self-pay | Admitting: *Deleted

## 2020-08-21 NOTE — Telephone Encounter (Signed)
  Follow up Call-  Call back number 08/19/2020  Post procedure Call Back phone  # [phone]  Permission to leave phone message Yes  Some recent data might be hidden     Patient questions:  Do you have a fever, pain , or abdominal swelling? No. Pain Score  0 *  Have you tolerated food without any problems? Yes.    Have you been able to return to your normal activities? Yes.    Do you have any questions about your discharge instructions: Diet   No. Medications  No. Follow up visit  No.  Do you have questions or concerns about your Care? No.  Actions: * If pain score is 4 or above: No action needed, pain <4.  Have you developed a fever since your procedure? no  2.   Have you had an respiratory symptoms (SOB or cough) since your procedure? no  3.   Have you tested positive for COVID 19 since your procedure no  4.   Have you had any family members/close contacts diagnosed with the COVID 19 since your procedure?  no   If yes to any of these questions please route to Joylene John, RN and Joella Prince, RN

## 2020-08-26 ENCOUNTER — Other Ambulatory Visit: Payer: Self-pay

## 2020-08-26 ENCOUNTER — Telehealth: Payer: Self-pay | Admitting: Hematology and Oncology

## 2020-08-26 ENCOUNTER — Ambulatory Visit: Payer: Medicare Other | Admitting: Gastroenterology

## 2020-08-26 ENCOUNTER — Inpatient Hospital Stay: Payer: Medicare Other

## 2020-08-26 ENCOUNTER — Inpatient Hospital Stay: Payer: Medicare Other | Attending: Hematology and Oncology | Admitting: Hematology and Oncology

## 2020-08-26 VITALS — BP 139/64 | HR 78 | Temp 97.5°F | Resp 18 | Ht 63.0 in | Wt 99.0 lb

## 2020-08-26 DIAGNOSIS — T18128A Food in esophagus causing other injury, initial encounter: Secondary | ICD-10-CM

## 2020-08-26 DIAGNOSIS — K222 Esophageal obstruction: Secondary | ICD-10-CM

## 2020-08-26 DIAGNOSIS — Z79899 Other long term (current) drug therapy: Secondary | ICD-10-CM | POA: Diagnosis not present

## 2020-08-26 DIAGNOSIS — C159 Malignant neoplasm of esophagus, unspecified: Secondary | ICD-10-CM | POA: Insufficient documentation

## 2020-08-26 LAB — CBC WITH DIFFERENTIAL (CANCER CENTER ONLY)
Abs Immature Granulocytes: 0.03 10*3/uL (ref 0.00–0.07)
Basophils Absolute: 0 10*3/uL (ref 0.0–0.1)
Basophils Relative: 0 %
Eosinophils Absolute: 0 10*3/uL (ref 0.0–0.5)
Eosinophils Relative: 0 %
HCT: 44.5 % (ref 36.0–46.0)
Hemoglobin: 14.5 g/dL (ref 12.0–15.0)
Immature Granulocytes: 0 %
Lymphocytes Relative: 25 %
Lymphs Abs: 2.8 10*3/uL (ref 0.7–4.0)
MCH: 29.1 pg (ref 26.0–34.0)
MCHC: 32.6 g/dL (ref 30.0–36.0)
MCV: 89.4 fL (ref 80.0–100.0)
Monocytes Absolute: 1.1 10*3/uL — ABNORMAL HIGH (ref 0.1–1.0)
Monocytes Relative: 10 %
Neutro Abs: 7.2 10*3/uL (ref 1.7–7.7)
Neutrophils Relative %: 65 %
Platelet Count: 324 10*3/uL (ref 150–400)
RBC: 4.98 MIL/uL (ref 3.87–5.11)
RDW: 12.9 % (ref 11.5–15.5)
WBC Count: 11.2 10*3/uL — ABNORMAL HIGH (ref 4.0–10.5)
nRBC: 0 % (ref 0.0–0.2)

## 2020-08-26 LAB — MAGNESIUM: Magnesium: 2.4 mg/dL (ref 1.7–2.4)

## 2020-08-26 LAB — CMP (CANCER CENTER ONLY)
ALT: 17 U/L (ref 0–44)
AST: 24 U/L (ref 15–41)
Albumin: 4.1 g/dL (ref 3.5–5.0)
Alkaline Phosphatase: 104 U/L (ref 38–126)
Anion gap: 15 (ref 5–15)
BUN: 15 mg/dL (ref 8–23)
CO2: 29 mmol/L (ref 22–32)
Calcium: 11.6 mg/dL — ABNORMAL HIGH (ref 8.9–10.3)
Chloride: 98 mmol/L (ref 98–111)
Creatinine: 0.94 mg/dL (ref 0.44–1.00)
GFR, Estimated: 57 mL/min — ABNORMAL LOW (ref >60)
Glucose, Bld: 114 mg/dL — ABNORMAL HIGH (ref 70–99)
Potassium: 5.9 mmol/L — ABNORMAL HIGH (ref 3.5–5.1)
Sodium: 142 mmol/L (ref 135–145)
Total Bilirubin: 0.7 mg/dL (ref 0.3–1.2)
Total Protein: 7.7 g/dL (ref 6.5–8.1)

## 2020-08-26 NOTE — Progress Notes (Signed)
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 Lorenso Courier  08/27/2020: anticipated CT C/A/P for staging  CHIEF COMPLAINTS/PURPOSE OF CONSULTATION:  "Squamous Cell Carcinoma of the Esophagus "  HISTORY OF PRESENTING ILLNESS:  Cheryl Holmes 87 y.o. female with medical history significant for newly diagnosed squamous cell carcinoma of the esophagus who presents for establishing care.  On review of the previous records Cheryl Holmes underwent EGD on 08/19/2020 due to dysphagia.  She had prior history of esophageal stenosis treated with dilation.  During the procedure she was noted to have a mass in the mid section of the esophagus.  Biopsy was obtained with pathology results confirming squamous cell carcinoma.  Due to concern for this finding the patient was referred to oncology for further evaluation management  On exam today [name] is accompanied by her daughter who flew in from [place].  Her name is Cheryl Holmes.  The patient reports that she has been having difficulty with eating.  She notes that she is restricted to liquids and very soft foods.  She notes that she feels like a "clogged up steak".  This has been going on for approximately 3 months time.  She met with Dr. [name] who was to perform the dilation but unfortunately found that this esophageal mass instead.  On further discussion the  patient notes that she was a smoker back in the 1950s and smoked only about 2 to 3 cigarettes/day.  She does have a history of Barrett's esophagus but has her reflux symptoms under much better control now.  Since all of her symptoms began she has had about a 10 pound weight loss.  She has not been having any difficulty with pain but has had a low-grade nausea as well as a feeling of constipation.  She has no remarkable surgical history other than an appendix at 86 years old and a perforation during her prior colonoscopy.  Regarding her family history she notes that she has a maternal uncle who had lymphoma but nothing else remarkable in the family other than some hypertension in her mother and heart trouble in her father.  She notes that she would previously have about a drink a day but that she has not been doing this since she developed her dysphagia issues.  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  She has 4 children, 7 grandchildren, and 3 great-grandchildren.  She does endorse having fatigue but otherwise is quite active getting up and down all day ambulating without difficulty and taking care of her activities of daily living.  A full 10 point ROS is listed below.  MEDICAL HISTORY:  Past Medical History:  Diagnosis Date   Actinic keratosis    Aftercare following surgery of the skin or subcutaneous tissue    Basal cell carcinoma of skin of other parts of face    Bone/cartilage disorder    nose   Neoplasm of uncertain behavior of skin    Screening for malignant  neoplasm of the rectum    Thyroid disease     SURGICAL HISTORY: Past Surgical History:  Procedure Laterality Date   APPENDECTOMY  1939   Dr. Griffin [facility]   BACK SURGERY  2005   Dr. Micheal Likens   Cancer Removal Forehead, Basal Cell N/A 11/24/2009   colonoscopy performation  2000   Dr. Maurene Capes, Dr.Weatherly   ESOPHAGOGASTRODUODENOSCOPY (EGD) WITH PROPOFOL N/A 06/06/2018   Procedure:  ESOPHAGOGASTRODUODENOSCOPY (EGD) WITH PROPOFOL;  Surgeon: Milus Banister, MD;  Location: [facility];  Service: Endoscopy;  Laterality: N/A;   FOREIGN BODY REMOVAL  06/06/2018   Procedure: FOREIGN BODY REMOVAL;  Surgeon: Milus Banister, MD;  Location: [facility];  Service: Endoscopy;;    SOCIAL HISTORY: Social History   Socioeconomic History   Marital status: Widowed    Spouse name: Not on file   Number of children: 4   Years of education: Not on file   Highest education level: Not on file  Occupational History   Occupation: retired  Tobacco Use   Smoking status: Former    Types: Cigarettes   Smokeless tobacco: Never   Tobacco comments:    Quit in the early 1950s  Vaping Use   Vaping Use: Never used  Substance and Sexual Activity   Alcohol use: Yes   Drug use: No   Sexual activity: Never  Other Topics Concern   Not on file  Social History Narrative   Tobacco use, amount per day now: NONE   Past tobacco use, amount per day: MINIMAL   How many years did you use tobacco: 3 OR 4    Alcohol use (drinks per week): 3 OR 4   Diet: REGULAR   Do you drink/eat things with caffeine: YES   Marital status:  WIDOWED                                What year were you married? 1951   Do you live in a house, apartment, assisted living, condo, trailer, etc.? APT   Is it one or more stories? YES   How many persons live in your home? A LOT   Do you have pets in your home?( please list) NO   Current or past profession: WORKED [place]   Do you exercise?     YES                             Type and how often? WALKING- CLASSES   Do you have a living will? YES   Do you have a DNR form?    YES                               If not, do you want to discuss one?   Do you have signed POA/HPOA forms?  YES                      If so, please bring to you appointment   Social Determinants of Health   Financial Resource Strain: Not on file  Food Insecurity: Not on file  Transportation Needs:  Not on file  Physical Activity: Not on file  Stress: Not on file  Social Connections: Not on file  Intimate Partner Violence: Not on file    FAMILY HISTORY: Family History  Problem Relation Age of Onset   Kidney disease Mother    Heart disease Father     ALLERGIES:  is allergic to polysporin [bacitracin-polymyxin b] and sulfonamide derivatives.  MEDICATIONS:  Current Outpatient Medications  Medication Sig Dispense Refill   amLODipine (NORVASC) 10 MG tablet Take 1 tablet (10 mg total) by mouth daily. 30 tablet 5   cholecalciferol (VITAMIN D) 1000 units tablet Take 2,000 Units by mouth daily.     levothyroxine (SYNTHROID) 50 MCG tablet TAKE 1 TABLET BY MOUTH ONCE DAILY BEFORE BREAKFAST 90 tablet 3   losartan (COZAAR) 25 MG tablet Take 1 tablet (25 mg total) by mouth 2 (two) times daily. 60 tablet 2   Current Facility-Administered Medications  Medication Dose Route Frequency Provider Last Rate Last Admin   0.9 %  sodium chloride infusion  500 mL Intravenous Once Irene Shipper, MD        REVIEW OF SYSTEMS:   Constitutional: ( - ) fevers, ( - )  chills , ( - ) night sweats Eyes: ( - ) blurriness of vision, ( - ) double vision, ( - ) watery eyes Ears, nose, mouth, throat, and face: ( - ) mucositis, ( - ) sore throat Respiratory: ( - ) cough, ( - ) dyspnea, ( - ) wheezes Cardiovascular: ( - ) palpitation, ( - ) chest discomfort, ( - ) lower extremity swelling Gastrointestinal:  ( - ) nausea, ( - ) heartburn, ( - ) change in bowel habits Skin: ( - ) abnormal skin rashes Lymphatics: ( - ) new lymphadenopathy, ( - ) easy bruising Neurological: ( - ) numbness, ( - ) tingling, ( - ) new weaknesses Behavioral/Psych: ( - ) mood change, ( - ) new changes  All other systems were reviewed with the patient and are negative.  PHYSICAL EXAMINATION: ECOG PERFORMANCE STATUS: 1 - Symptomatic but completely ambulatory  Vitals:   08/26/20 0856  BP: 139/64  Pulse: 78  Resp: 18  Temp: (!) 97.5  F (36.4 C)  SpO2: 100%   Filed Weights   08/26/20 0856  Weight: 99 lb (44.9 kg)    GENERAL: well appearing elderly Caucasian female in NAD  SKIN: skin color, texture, turgor are normal, no rashes or significant lesions EYES: conjunctiva are pink and non-injected, sclera clear LUNGS: clear to auscultation and percussion with normal breathing effort HEART: regular rate & rhythm and no murmurs and no lower extremity edema PSYCH: alert & oriented x 3, fluent speech NEURO: no focal motor/sensory deficits  LABORATORY DATA:  I have reviewed the data as listed CBC Latest Ref Rng & Units 08/26/2020 06/30/2020 07/11/2019  WBC 4.0 - 10.5 K/uL 11.2(H) 7.5 6.3  Hemoglobin 12.0 - 15.0 g/dL 14.5 14.2 15.1  Hematocrit 36.0 - 46.0 % 44.5 43 45  Platelets 150 - 400 K/uL 324 397 265    CMP Latest Ref Rng & Units 08/26/2020 06/30/2020 07/11/2019  Glucose 70 - 99 mg/dL 114(H) - -  BUN 8 - 23 mg/dL $Remove'15 9 20  'mNxVXbb$ Creatinine 0.44 - 1.00 mg/dL 0.94 0.7 0.9  Sodium 135 - 145 mmol/L 142 141 138  Potassium 3.5 - 5.1 mmol/L 5.9(H) 4.7 4.4  Chloride 98 - 111 mmol/L 98 - 103  CO2 22 - 32 mmol/L 29 - 23(A)  Calcium 8.9 - 10.3 mg/dL 11.6(H) 10.5 11.1(A)  Total Protein 6.5 - 8.1 g/dL 7.7 - -  Total Bilirubin 0.3 - 1.2 mg/dL 0.7 - -  Alkaline Phos 38 - 126 U/L 104 - -  AST 15 - 41 U/L 24 - 20  ALT 0 - 44 U/L 17 - 12   RADIOGRAPHIC STUDIES: I have personally reviewed the radiological images as listed and agreed with the findings in the report. CT CHEST ABDOMEN PELVIS W CONTRAST  Result Date: 08/28/2020 CLINICAL DATA:  87 year old female with history of esophageal cancer. Staging examination. EXAM: CT CHEST, ABDOMEN, AND PELVIS WITH CONTRAST TECHNIQUE: Multidetector CT imaging of the chest, abdomen and pelvis was performed following the standard protocol during bolus administration of intravenous contrast. CONTRAST:  28mL OMNIPAQUE IOHEXOL 350 MG/ML SOLN COMPARISON:  No priors. FINDINGS: CT CHEST FINDINGS  Cardiovascular: Heart size is normal. There is no significant pericardial fluid, thickening or pericardial calcification. There is aortic atherosclerosis, as well as atherosclerosis of the great vessels of the mediastinum and the coronary arteries, including calcified atherosclerotic plaque in the left main and left anterior descending coronary arteries. Mediastinum/Nodes: Enlarged paraesophageal lymph node measuring 1 cm in short axis (axial image 40 of series 2). No other pathologically enlarged mediastinal or hilar lymph nodes. Mid esophageal mass estimated to measure approximately 2.4 x 2.0 x 3.0 cm (axial image 32 of series 2 and coronal image 32 of series 5). No axillary lymphadenopathy. Lungs/Pleura: Widespread areas of pleuroparenchymal thickening, architectural distortion, cylindrical bronchiectasis and peripheral bronchiolectasis noted in the upper lobes of the lungs bilaterally and the superior aspects of the superior segments of the lower lobes of the lungs bilaterally, most compatible with chronic post infectious or inflammatory scarring. Innumerable tiny 1-2 mm pulmonary nodules noted throughout the periphery of the lungs, particularly in the upper lungs, most compatible with areas of chronic mucoid impaction within terminal bronchioles. No other larger more suspicious appearing pulmonary nodules or masses are noted to suggest the presence of metastatic disease. No acute consolidative airspace disease. No pleural effusions. Musculoskeletal: There are no aggressive appearing lytic or blastic lesions noted in the visualized portions of the skeleton. CT ABDOMEN PELVIS FINDINGS Hepatobiliary: Subcentimeter low-attenuation lesions in the liver (axial image 51 of series 2), incompletely characterized due to their small size. No intra or extrahepatic biliary ductal dilatation. Gallbladder is normal in appearance. Pancreas: No pancreatic mass. No pancreatic ductal dilatation. No pancreatic or peripancreatic  fluid collections or inflammatory changes. Spleen: Unremarkable. Adrenals/Urinary Tract: Subcentimeter low-attenuation lesion in the lower pole of the right kidney, too small to characterize, but statistically likely to represent a tiny cyst. Left kidney and bilateral adrenal glands are normal in appearance. No hydroureteronephrosis. Urinary bladder is normal in appearance. Stomach/Bowel: There is some mural thickening in the proximal stomach (best appreciated on axial image 51 of series 2) which is separated from the esophageal mass. Distal aspects of the stomach are normal. No pathologic dilatation of small bowel or colon. Numerous colonic diverticulae are noted, without surrounding inflammatory changes to suggest an acute diverticulitis at this time. The appendix is not confidently identified and may be surgically absent. Regardless, there are no inflammatory changes noted adjacent to the cecum to suggest the presence of an acute appendicitis at this time. Vascular/Lymphatic: Aortic atherosclerosis, without evidence of aneurysm or dissection in the abdominal or pelvic vasculature. No lymphadenopathy noted in the abdomen or pelvis. Reproductive: Uterus and right ovary are unremarkable in appearance. In the left adnexa (axial image 103 of series 2 and coronal image 35 of series 5) there is a large 3.9 x 3.2 x 4.4 cm intermediate attenuation mass (48 HU). Other: No significant volume of ascites.  No pneumoperitoneum. Musculoskeletal: There are no aggressive appearing  lytic or blastic lesions noted in the visualized portions of the skeleton. IMPRESSION: 1. Large mass in the mid esophagus with adjacent enhancing enlarged middle mediastinal lymph node highly concerning for local nodal metastasis. 2. Mass-like thickening in the proximal aspect of the stomach, geographically separate from the previously described esophageal mass. A second gastric neoplasm is difficult to exclude, and attention at time of endoscopy is  suggested. 3. Large intermediate attenuation mass in the left adnexa, highly abnormal in a postmenopausal female. Further evaluation with nonemergent pelvic ultrasound is recommended in the near future to better characterize this finding, as ovarian malignancy is not excluded. 4. Aortic atherosclerosis, in addition to left main and left anterior descending coronary artery disease. 5. 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 6. Colonic diverticulosis without evidence of acute diverticulitis at this time. 7. Additional incidental findings, as above. Electronically Signed   By: Vinnie Langton M.D.   On: 08/28/2020 15:35    ASSESSMENT & PLAN Cheryl Holmes 87 y.o. female with medical history significant for newly diagnosed squamous cell carcinoma of the esophagus who presents for establishing care.  After review of the labs, review of the records, and discussion with the patient the patients findings are most consistent with a squamous cell carcinoma of the esophagus.  It appears to be causing obstruction and therefore I would recommend referral to radiation oncology for consideration of palliative radiation in order to help alleviate the obstruction.  At this time it is unclear what stage the patient is and therefore we will require further imaging to confirm.  She is scheduled for a CT chest abdomen pelvis tomorrow, but per recommendations of radiation oncology it would be more helpful to get a PET CT scan as well.  Once these are obtained we can have more discussions about possible options regarding treatment.  Given her advanced age I do not believe that she would be a surgical candidate.  Additionally chemotherapy could be particularly risky with the patient in her age group.  Once the results available we could potentially discuss chemoradiation or other options moving forward.  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  Radiation oncology has noted they would like to have a PET CT scan as well. --Patient is agreeable to having a PEG placed in order to help assist with nutrition. --I do not believe the patient to be a surgical candidate at this time.  Given her advanced age I would also be concerned about administration of chemotherapy.  We will await the results of the staging scans in order to help determine what options may be available to her. --Return to clinic pending the results of the above scans.  Orders Placed This Encounter  Procedures   IR Gastrostomy Tube    Standing Status:   Future    Standing Expiration Date:   08/26/2021    Order Specific Question:   Reason for exam:    Answer:   esophageal cancer, dysphasia    Order Specific Question:   Preferred Imaging Location?    Answer:   [facility]   NM PET Image Initial (PI) Skull Base To Thigh    Standing Status:   Future    Standing Expiration Date:   08/26/2021    Order Specific Question:   If indicated for the ordered procedure, I authorize the administration of a radiopharmaceutical per Radiology protocol  Answer:   Yes    Order Specific Question:   Preferred imaging location?    Answer:   [facility]   CBC with Differential (Cancer Center Only)    Standing Status:   Future    Number of Occurrences:   1    Standing Expiration Date:   08/26/2021   CMP ([facility] only)    Standing Status:   Future    Number of Occurrences:   1    Standing Expiration Date:   08/26/2021   Magnesium    Standing Status:   Future    Number of Occurrences:   1    Standing Expiration Date:   08/26/2021   Ambulatory referral to Radiation Oncology    Referral Priority:   Urgent    Referral Type:   Consultation    Referral Reason:   Specialty Services Required    Requested Specialty:   Radiation Oncology    Number of Visits Requested:   1   Ambulatory Referral to [facility] Nutrition    Referral Priority:   Urgent     Referral Type:   Consultation    Referral Reason:   Specialty Services Required    Number of Visits Requested:   1    All questions were answered. The patient knows to call the clinic with any problems, questions or concerns.  A total of more than 60 minutes were spent on this encounter with face-to-face time and non-face-to-face time, including preparing to see the patient, ordering tests and/or medications, counseling the patient and coordination of care as outlined above.   Ledell Peoples, MD Department of Hematology/Oncology [place] [facility] Phone: [phone] Pager: [phone] Email: [email or web address]  08/30/2020 5:44 PM

## 2020-08-26 NOTE — Progress Notes (Signed)
I met with Ms Cheryl Holmes and her daughter today after her appt with Dr Lorenso Courier.  I reviewed my role as nurse navigator and provided my direct contact information.  I reviewed PEG tube basic placement and usage.  She is agreeable to this. An order for placement has been placed with IR.  I encouraged her to meet with our dieticians for guidance now and after PEG placement.  She is agreeable.  All questions were answered.  Both Ms Cheryl Holmes and her daughter verbalized understanding

## 2020-08-26 NOTE — Telephone Encounter (Signed)
Scheduled appointment per 07/20 sch msg. Patient is aware.

## 2020-08-27 ENCOUNTER — Ambulatory Visit (HOSPITAL_BASED_OUTPATIENT_CLINIC_OR_DEPARTMENT_OTHER)
Admission: RE | Admit: 2020-08-27 | Discharge: 2020-08-27 | Disposition: A | Discharge status: Home / Self Care | Payer: Medicare Other | Source: Ambulatory Visit | Attending: Internal Medicine | Admitting: Internal Medicine

## 2020-08-27 ENCOUNTER — Inpatient Hospital Stay: Payer: Medicare Other | Admitting: Nutrition

## 2020-08-27 ENCOUNTER — Telehealth: Payer: Self-pay | Admitting: Radiation Oncology

## 2020-08-27 ENCOUNTER — Encounter (HOSPITAL_BASED_OUTPATIENT_CLINIC_OR_DEPARTMENT_OTHER): Payer: Self-pay | Admitting: Radiology

## 2020-08-27 DIAGNOSIS — J8410 Pulmonary fibrosis, unspecified: Secondary | ICD-10-CM | POA: Diagnosis not present

## 2020-08-27 DIAGNOSIS — C159 Malignant neoplasm of esophagus, unspecified: Secondary | ICD-10-CM

## 2020-08-27 DIAGNOSIS — R911 Solitary pulmonary nodule: Secondary | ICD-10-CM | POA: Diagnosis not present

## 2020-08-27 DIAGNOSIS — K5730 Diverticulosis of large intestine without perforation or abscess without bleeding: Secondary | ICD-10-CM | POA: Diagnosis not present

## 2020-08-27 DIAGNOSIS — R590 Localized enlarged lymph nodes: Secondary | ICD-10-CM | POA: Diagnosis not present

## 2020-08-27 DIAGNOSIS — I2510 Atherosclerotic heart disease of native coronary artery without angina pectoris: Secondary | ICD-10-CM | POA: Diagnosis not present

## 2020-08-27 MED ORDER — IOHEXOL 350 MG/ML SOLN
60.0000 mL | Freq: Once | INTRAVENOUS | Status: AC | PRN
  Administered 2020-08-27: 60 mL via INTRAVENOUS

## 2020-08-27 MED ORDER — ACETAMINOPHEN 325 MG PO TABS
ORAL_TABLET | ORAL | Status: AC
  Filled 2020-08-27: qty 2

## 2020-08-27 MED ORDER — DIPHENHYDRAMINE HCL 25 MG PO CAPS
ORAL_CAPSULE | ORAL | Status: AC
  Filled 2020-08-27: qty 1

## 2020-08-27 NOTE — Telephone Encounter (Signed)
Called patient to schedule consultation with Dr. Lisbeth Renshaw. No answer, LVM for a return call.

## 2020-08-27 NOTE — Progress Notes (Signed)
87 year old female diagnosed with esophagus cancer in the process of work-up.  She is followed by Dr. Lorenso Courier and Dr. Isidore Moos.  Past medical history includes thyroid disease, GERD, and hypertension.  Medications include Synthroid and Colace.  Labs include glucose 114 and potassium 5.9.  Height: 5 feet 3 inches. Weight: 99 pounds on July 20. Usual body weight: 110 pounds 1 year ago. BMI: 17.54.  Patient has had a 10% weight loss over 1 year which is not significant.  Patient is underweight. Nutrition focused physical exam deferred.  Patient presents with daughter.  Reports she can swallow liquids and pured foods without difficulty.  She does need to take her time and eat/drink small amounts.  She reports she is trying to eat or drink something every 1-2 hours.  Reports tolerating Gatorade, ginger ale, Ensure, pured soups, and yogurt.  Patient is considering feeding tube placement and radiation therapy.  Nutrition diagnosis: Unintended weight loss related to esophageal cancer and dysphagia as evidenced by 10% weight loss over 1 year.  Intervention: Educated on small amounts of high-calorie, high-protein foods.  Continue small frequent feedings.  Recommend Ensure complete twice daily.  Provided samples and coupons. Also provided samples of [employer] for variety.  Patient is interested in receiving free samples and has given permission to have these shipped to her home. Discontinue Gatorade secondary to high potassium. Brief education provided on PEG care and infusion of water and nutrition supplement. Provided nutrition fact sheet on protein foods and eating with dysphagia.  Questions were answered.  Contact information given.  Monitoring, evaluation, goals: Patient will tolerate increased calories and protein to minimize weight loss and improve/maintain muscle mass  Next visit: To be scheduled as needed.  **Disclaimer: This note was dictated with voice recognition software.  Similar sounding words can inadvertently be transcribed and this note may contain transcription errors which may not have been corrected upon publication of note.**

## 2020-08-28 ENCOUNTER — Ambulatory Visit: Payer: Medicare Other

## 2020-08-28 ENCOUNTER — Institutional Professional Consult (permissible substitution): Payer: Medicare Other | Admitting: Radiation Oncology

## 2020-08-31 ENCOUNTER — Telehealth: Payer: Self-pay | Admitting: *Deleted

## 2020-08-31 NOTE — Telephone Encounter (Signed)
Received call from pt regarding her PEG tube placement. She had been undecided about it previously, but has now decided to go forward with it. TCT Tiffany in IR and advised that pt does want the PEG vtube placed. Tiffany states she will call pt to set this up.

## 2020-09-01 ENCOUNTER — Encounter: Payer: Self-pay | Admitting: Hematology and Oncology

## 2020-09-01 ENCOUNTER — Telehealth: Payer: Self-pay | Admitting: Hematology and Oncology

## 2020-09-01 NOTE — Telephone Encounter (Signed)
Scherdued per los. Called and spoke with patient. Confirmed appt

## 2020-09-04 ENCOUNTER — Telehealth: Payer: Self-pay | Admitting: *Deleted

## 2020-09-04 NOTE — Telephone Encounter (Signed)
Spoke with daughter about appt on Wed, 09/09/20. Suggested we mover her appt to after the PET scan which is on 09/17/20.  Daughter, Debroah Baller states pt is not doing as well and is not taking in much nutrition. She prefers to keep appt on 09/09/20. She also hopes she can convince pt to move up her PEG tube placement sooner than 09/29/20. She thinks it may be too late at that point as pt will be too weak to go through with procedure. Advised that we can talk about this on Wednesday while she talks to pt over the weekend.  Dr. Lorenso Courier aware.

## 2020-09-09 ENCOUNTER — Inpatient Hospital Stay: Payer: Medicare Other | Admitting: Hematology and Oncology

## 2020-09-09 ENCOUNTER — Inpatient Hospital Stay: Payer: Medicare Other

## 2020-09-10 ENCOUNTER — Encounter: Payer: Self-pay | Admitting: Radiation Oncology

## 2020-09-14 ENCOUNTER — Other Ambulatory Visit (HOSPITAL_COMMUNITY): Payer: Medicare Other

## 2020-09-16 ENCOUNTER — Telehealth: Payer: Self-pay | Admitting: *Deleted

## 2020-09-16 NOTE — Telephone Encounter (Signed)
Spoke with that patient to remind her of her upcoming appointment for a PET scan 09/17/2020.  She was informed that she is only allowed to drink water the morning of the scan and to start drinking her contrast at 8:00 am and the second at 9:00 am.  She verbalized understanding of all instructions.    Gloriajean Dell. Leonie Green, BSN

## 2020-09-17 ENCOUNTER — Other Ambulatory Visit: Payer: Self-pay

## 2020-09-17 ENCOUNTER — Ambulatory Visit (HOSPITAL_COMMUNITY)
Admission: RE | Admit: 2020-09-17 | Discharge: 2020-09-17 | Disposition: A | Discharge status: Home / Self Care | Payer: Medicare Other | Source: Ambulatory Visit | Attending: Hematology and Oncology | Admitting: Hematology and Oncology

## 2020-09-17 DIAGNOSIS — T18128A Food in esophagus causing other injury, initial encounter: Secondary | ICD-10-CM | POA: Insufficient documentation

## 2020-09-17 DIAGNOSIS — C159 Malignant neoplasm of esophagus, unspecified: Secondary | ICD-10-CM | POA: Insufficient documentation

## 2020-09-17 DIAGNOSIS — K222 Esophageal obstruction: Secondary | ICD-10-CM | POA: Insufficient documentation

## 2020-09-17 LAB — GLUCOSE, CAPILLARY: Glucose-Capillary: 120 mg/dL — ABNORMAL HIGH (ref 70–99)

## 2020-09-17 MED ORDER — FLUDEOXYGLUCOSE F - 18 (FDG) INJECTION
4.7800 | Freq: Once | INTRAVENOUS | Status: AC
  Administered 2020-09-17: 4.78 via INTRAVENOUS

## 2020-09-18 ENCOUNTER — Inpatient Hospital Stay (HOSPITAL_BASED_OUTPATIENT_CLINIC_OR_DEPARTMENT_OTHER): Payer: Medicare Other | Admitting: Hematology and Oncology

## 2020-09-18 ENCOUNTER — Other Ambulatory Visit: Payer: Self-pay | Admitting: Hematology and Oncology

## 2020-09-18 ENCOUNTER — Inpatient Hospital Stay: Payer: Medicare Other | Attending: Hematology and Oncology

## 2020-09-18 VITALS — BP 155/56 | HR 89 | Temp 97.3°F | Resp 16 | Ht 63.0 in | Wt 100.3 lb

## 2020-09-18 DIAGNOSIS — C154 Malignant neoplasm of middle third of esophagus: Secondary | ICD-10-CM

## 2020-09-18 DIAGNOSIS — E039 Hypothyroidism, unspecified: Secondary | ICD-10-CM | POA: Insufficient documentation

## 2020-09-18 DIAGNOSIS — R6881 Early satiety: Secondary | ICD-10-CM | POA: Diagnosis not present

## 2020-09-18 DIAGNOSIS — L570 Actinic keratosis: Secondary | ICD-10-CM | POA: Insufficient documentation

## 2020-09-18 DIAGNOSIS — C155 Malignant neoplasm of lower third of esophagus: Secondary | ICD-10-CM | POA: Diagnosis not present

## 2020-09-18 DIAGNOSIS — Z79899 Other long term (current) drug therapy: Secondary | ICD-10-CM | POA: Insufficient documentation

## 2020-09-18 DIAGNOSIS — Z85828 Personal history of other malignant neoplasm of skin: Secondary | ICD-10-CM | POA: Insufficient documentation

## 2020-09-18 DIAGNOSIS — K222 Esophageal obstruction: Secondary | ICD-10-CM | POA: Diagnosis not present

## 2020-09-18 DIAGNOSIS — Z87891 Personal history of nicotine dependence: Secondary | ICD-10-CM | POA: Diagnosis not present

## 2020-09-18 DIAGNOSIS — T18128A Food in esophagus causing other injury, initial encounter: Secondary | ICD-10-CM

## 2020-09-18 LAB — CMP (CANCER CENTER ONLY)
ALT: 16 U/L (ref 0–44)
AST: 15 U/L (ref 15–41)
Albumin: 3.5 g/dL (ref 3.5–5.0)
Alkaline Phosphatase: 110 U/L (ref 38–126)
Anion gap: 10 (ref 5–15)
BUN: 20 mg/dL (ref 8–23)
CO2: 28 mmol/L (ref 22–32)
Calcium: 10.9 mg/dL — ABNORMAL HIGH (ref 8.9–10.3)
Chloride: 100 mmol/L (ref 98–111)
Creatinine: 0.86 mg/dL (ref 0.44–1.00)
GFR, Estimated: >60 mL/min (ref >60)
Glucose, Bld: 135 mg/dL — ABNORMAL HIGH (ref 70–99)
Potassium: 5.2 mmol/L — ABNORMAL HIGH (ref 3.5–5.1)
Sodium: 138 mmol/L (ref 135–145)
Total Bilirubin: 0.5 mg/dL (ref 0.3–1.2)
Total Protein: 7.5 g/dL (ref 6.5–8.1)

## 2020-09-18 LAB — CBC WITH DIFFERENTIAL (CANCER CENTER ONLY)
Abs Immature Granulocytes: 0.04 10*3/uL (ref 0.00–0.07)
Basophils Absolute: 0.1 10*3/uL (ref 0.0–0.1)
Basophils Relative: 0 %
Eosinophils Absolute: 0.1 10*3/uL (ref 0.0–0.5)
Eosinophils Relative: 1 %
HCT: 42.5 % (ref 36.0–46.0)
Hemoglobin: 13.9 g/dL (ref 12.0–15.0)
Immature Granulocytes: 0 %
Lymphocytes Relative: 23 %
Lymphs Abs: 2.7 10*3/uL (ref 0.7–4.0)
MCH: 28.7 pg (ref 26.0–34.0)
MCHC: 32.7 g/dL (ref 30.0–36.0)
MCV: 87.8 fL (ref 80.0–100.0)
Monocytes Absolute: 1.4 10*3/uL — ABNORMAL HIGH (ref 0.1–1.0)
Monocytes Relative: 12 %
Neutro Abs: 7.5 10*3/uL (ref 1.7–7.7)
Neutrophils Relative %: 64 %
Platelet Count: 436 10*3/uL — ABNORMAL HIGH (ref 150–400)
RBC: 4.84 MIL/uL (ref 3.87–5.11)
RDW: 13.2 % (ref 11.5–15.5)
WBC Count: 11.7 10*3/uL — ABNORMAL HIGH (ref 4.0–10.5)
nRBC: 0 % (ref 0.0–0.2)

## 2020-09-21 NOTE — Progress Notes (Signed)
GI Location of Tumor / Histology: Esophageal Cancer  Cheryl Holmes presented with complaints of dysphagia.  She has prior history of esophageal stenosis treated with dilation.  She reports she started having difficulty with swallowing whole foods the beginning of June 2022.  PET [id number]: Hypermetabolic lesion in the subcarinal esophagus consistent primary esophageal carcinoma.  Several mildly metabolic mediastinal lymph nodes are concerning for mediastinal nodal metastasis.  Single hypermetabolic gastrohepatic ligament node is indeterminate.  No evidence of malignancy within the stomach.  No liver metastasis.  CT CAP 08/28/2020: Large mass in the mid esophagus with adjacent enhancing enlarged middle mediastinal lymph node highly concerning for local nodal metastasis.  Mass-like thickening in the proximal aspect of the stomach.  A second gastric neoplasm is difficult to exclude, and attention at time of endoscopy is suggested.  Upper Endoscopy 08/19/2020: The esophagus proximal esophagus revealed a large caliber web at 18 cm from the incisors.  There was an inlet patch as previously noted.  A large ulcerating mass with bleeding was found in the lower third of the esophagus.  The mass was near obstructing and circumferential.  This measured approximately 4 cm in length and was located several centimeters above the gastroesophageal junction.  Biopsies of Esophageal Mass 08/19/2020    Past/Anticipated interventions by surgeon, if any:   Past/Anticipated interventions by medical oncology, if any:  Dr. 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   -It appears to be causing obstruction and therefore I would recommend referral to radiation oncology for consideration of palliative radiation in order to help alleviate the obstruction. -At this time it is unclear what stage the patient is and therefore we will require further imaging to  confirm. -She is scheduled for a CT chest abdomen pelvis tomorrow, but per recommendations of radiation oncology it would be more helpful to get a PET CT scan as well.   -Once these are obtained we can have more discussions about possible options regarding treatment.   -Given her advanced age I do not believe that she would be a surgical candidate.   -Additionally chemotherapy could be particularly risky with the patient in her age group.    Weight changes, if any: She weighed yesterday and was 95.6 lb without clothing.  Normal weight was about 110.   Bowel/Bladder complaints, if any: She reports constipation, takes liquid colace with ginger ale, takes miralax on occasion.  Small amounts of urine, slow stream, going about 3-4 times per day.  She reports her stream is better at night and early morning.  Nausea / Vomiting, if any: No vomiting noted.  She reports nausea in the morning.    Pain issues, if any:  none noted.  Denies heartburn or indigestion.  Appetite: Supplementing with ensure and [employer] complete nutrition at recommendation of nutritionist (1 each per day).  Eating jello, pudding and yogurt with her pills.  SAFETY ISSUES: Prior radiation? No Pacemaker/ICD? No Possible current pregnancy? Postmenopausal Is the patient on methotrexate? No  Current Complaints/Details: Lives at [facility]

## 2020-09-22 ENCOUNTER — Encounter: Payer: Self-pay | Admitting: Hematology and Oncology

## 2020-09-22 ENCOUNTER — Ambulatory Visit
Admission: RE | Admit: 2020-09-22 | Discharge: 2020-09-22 | Disposition: A | Discharge status: Home / Self Care | Payer: Medicare Other | Source: Ambulatory Visit | Attending: Radiation Oncology | Admitting: Radiation Oncology

## 2020-09-22 ENCOUNTER — Encounter: Payer: Self-pay | Admitting: Radiation Oncology

## 2020-09-22 ENCOUNTER — Encounter: Payer: Self-pay | Admitting: *Deleted

## 2020-09-22 ENCOUNTER — Other Ambulatory Visit: Payer: Self-pay

## 2020-09-22 VITALS — BP 140/58 | HR 78 | Temp 96.3°F | Resp 18 | Ht 63.0 in | Wt 101.0 lb

## 2020-09-22 DIAGNOSIS — Z87891 Personal history of nicotine dependence: Secondary | ICD-10-CM | POA: Insufficient documentation

## 2020-09-22 DIAGNOSIS — I2510 Atherosclerotic heart disease of native coronary artery without angina pectoris: Secondary | ICD-10-CM | POA: Diagnosis not present

## 2020-09-22 DIAGNOSIS — K5900 Constipation, unspecified: Secondary | ICD-10-CM | POA: Diagnosis not present

## 2020-09-22 DIAGNOSIS — I700 Atherosclerosis of aorta: Secondary | ICD-10-CM | POA: Diagnosis not present

## 2020-09-22 DIAGNOSIS — R634 Abnormal weight loss: Secondary | ICD-10-CM | POA: Insufficient documentation

## 2020-09-22 DIAGNOSIS — C155 Malignant neoplasm of lower third of esophagus: Secondary | ICD-10-CM

## 2020-09-22 DIAGNOSIS — Z85828 Personal history of other malignant neoplasm of skin: Secondary | ICD-10-CM | POA: Insufficient documentation

## 2020-09-22 DIAGNOSIS — Z79899 Other long term (current) drug therapy: Secondary | ICD-10-CM | POA: Insufficient documentation

## 2020-09-22 DIAGNOSIS — E079 Disorder of thyroid, unspecified: Secondary | ICD-10-CM | POA: Insufficient documentation

## 2020-09-22 DIAGNOSIS — I10 Essential (primary) hypertension: Secondary | ICD-10-CM | POA: Diagnosis not present

## 2020-09-22 HISTORY — DX: Essential (primary) hypertension: I10

## 2020-09-22 HISTORY — DX: Malignant neoplasm of esophagus, unspecified: C15.9

## 2020-09-22 NOTE — Progress Notes (Signed)
Radiation Oncology         [phone] ________________________________  Name: Cheryl Holmes        MRN: [id number]  Date of Service: 09/22/2020 DOB: Mar 07, 1926  [id number], [name], MD  [name], MD     REFERRING PHYSICIAN: [name], MD   DIAGNOSIS: The encounter diagnosis was Malignant neoplasm of lower third of esophagus ([place]).   HISTORY OF PRESENT ILLNESS: Cheryl Holmes is a 87 y.o. female seen at the request of Dr. Lorenso Courier for a newly diagnosed squamous cell carcinoma.  Patient developed progressive dysphagia starting in May 2022 and has lost approximately 5 to 10 pounds pending on which scale she weighs on.  She was having difficulty with tolerating solids at that time and has now developed progressive symptoms to where she is only tolerating liquids.   She was seen by GI interestingly enough in 2020 for food impaction, no abnormalities were seen following the removal of the piece of meat at that time to suggest a problem in the esophagus but an upper endoscopy on 08/19/2020 with Dr. [name] showed a large caliber web 18 cm from the incisors, in the proximal esophagus as well as a distal esophageal mass in the lower third of the esophagus measuring 4 cm in length several centimeters above the gastroesophageal junction.  She underwent CT scan of the chest staging on 08/27/2020 which showed progress with adjacent enhancement enlarged middle mediastinal lymph slight thickening in the proximal aspect of the stomach geographically separate from the esophageal mass was identified and a large indeterminate attenuating mass in the left stigmata of atherosclerosis and postinfectious scarring in the upper lungs bilaterally was identified.  She and a PET scan on 09/17/2020 that showed hypermetabolism within the subcarinal esophagus consistent with primary esophageal carcinoma mildly metabolic mediastinal lymph nodes are concerning for nodal metastases and a single hypermetabolic  gastrohepatic ligament node is indeterminate.  No evidence of malignancy within the stomach or liver was identified.  No hypermetabolism was seen in the pelvis.  She is not a good candidate for systemic chemotherapy after discussing with Dr. Lorenso Courier and is seen today to discuss options of radiotherapy.    PREVIOUS RADIATION THERAPY: No   PAST MEDICAL HISTORY:  Past Medical History:  Diagnosis Date   Actinic keratosis    Aftercare following surgery of the skin or subcutaneous tissue    Basal cell carcinoma of skin of other parts of face    Bone/cartilage disorder    nose   Esophagus cancer ([place])    Hypertension    Neoplasm of uncertain behavior of skin    Screening for malignant neoplasm of the rectum    Thyroid disease        PAST SURGICAL HISTORY: Past Surgical History:  Procedure Laterality Date   APPENDECTOMY  1939   Dr. Griffin [facility]   BACK SURGERY  2005   Dr. Micheal Likens   Cancer Removal Forehead, Basal Cell N/A 11/24/2009   colonoscopy performation  2000   Dr. Maurene Capes, Dr.Weatherly   ESOPHAGOGASTRODUODENOSCOPY (EGD) WITH PROPOFOL N/A 06/06/2018   Procedure: ESOPHAGOGASTRODUODENOSCOPY (EGD) WITH PROPOFOL;  Surgeon: Milus Banister, MD;  Location: [facility];  Service: Endoscopy;  Laterality: N/A;   FOREIGN BODY REMOVAL  06/06/2018   Procedure: FOREIGN BODY REMOVAL;  Surgeon: Milus Banister, MD;  Location: [facility];  Service: Endoscopy;;     FAMILY HISTORY:  Family History  Problem Relation Age of Onset   Kidney disease  Mother    Heart disease Father      SOCIAL HISTORY:  reports that she has quit smoking. Her smoking use included cigarettes. She has never used smokeless tobacco. She reports current alcohol use. She reports that she does not use drugs.The patient is widow and lives at [facility]. She's accompanied by her daughter [name].    ALLERGIES: Polysporin [bacitracin-polymyxin b] and Sulfonamide derivatives   MEDICATIONS:  Current Outpatient  Medications  Medication Sig Dispense Refill   amLODipine (NORVASC) 10 MG tablet Take 1 tablet (10 mg total) by mouth daily. 30 tablet 5   levothyroxine (SYNTHROID) 50 MCG tablet TAKE 1 TABLET BY MOUTH ONCE DAILY BEFORE BREAKFAST 90 tablet 3   losartan (COZAAR) 25 MG tablet Take 1 tablet (25 mg total) by mouth 2 (two) times daily. 60 tablet 2   Current Facility-Administered Medications  Medication Dose Route Frequency Provider Last Rate Last Admin   0.9 %  sodium chloride infusion  500 mL Intravenous Once Irene Shipper, MD         REVIEW OF SYSTEMS: On review of systems, the patient reports that she is doing as well as she can expect with her current situation. Since May, she's had progressive difficulty swallowing firm or normal textured foods. She's been tolerating liquids, jello, and protein shakes. Prior to her current situation she was walking a mile per day and plays bridge regularly. She's been struggling with constipation and is taking miralax. No other complaints are verbalized.      PHYSICAL EXAM:  Wt Readings from Last 3 Encounters:  09/22/20 101 lb (45.8 kg)  09/18/20 100 lb 4.8 oz (45.5 kg)  09/17/20 95 lb (43.1 kg)   Temp Readings from Last 3 Encounters:  09/22/20 (!) 96.3 F (35.7 C) (Temporal)  09/18/20 (!) 97.3 F (36.3 C) (Oral)  08/26/20 (!) 97.5 F (36.4 C) (Temporal)   BP Readings from Last 3 Encounters:  09/22/20 (!) 140/58  09/18/20 (!) 155/56  08/26/20 139/64   Pulse Readings from Last 3 Encounters:  09/22/20 78  09/18/20 89  08/26/20 78   Pain Assessment Pain Score: 0-No pain/10  In general this is a well appearing caucasian female in no acute distress. She's alert and oriented x4 and appropriate throughout the examination. Cardiopulmonary assessment is negative for acute distress and she exhibits normal effort.     ECOG = 0  0 - Asymptomatic (Fully active, able to carry on all predisease activities without restriction)  1 - Symptomatic but  completely ambulatory (Restricted in physically strenuous activity but ambulatory and able to carry out work of a light or sedentary nature. For example, light housework, office work)  2 - Symptomatic, <50% in bed during the day (Ambulatory and capable of all self care but unable to carry out any work activities. Up and about more than 50% of waking hours)  3 - Symptomatic, >50% in bed, but not bedbound (Capable of only limited self-care, confined to bed or chair 50% or more of waking hours)  4 - Bedbound (Completely disabled. Cannot carry on any self-care. Totally confined to bed or chair)  5 - Death   Eustace Pen MM, Creech RH, Tormey DC, et al. [phone]). "Toxicity and response criteria of the [facility]". [place] Oncol. 5 (6): 649-55    LABORATORY DATA:  Lab Results  Component Value Date   WBC 11.7 (H) 09/18/2020   HGB 13.9 09/18/2020   HCT 42.5 09/18/2020   MCV 87.8 09/18/2020  PLT 436 (H) 09/18/2020   Lab Results  Component Value Date   NA 138 09/18/2020   K 5.2 (H) 09/18/2020   CL 100 09/18/2020   CO2 28 09/18/2020   Lab Results  Component Value Date   ALT 16 09/18/2020   AST 15 09/18/2020   ALKPHOS 110 09/18/2020   BILITOT 0.5 09/18/2020      RADIOGRAPHY: CT CHEST ABDOMEN PELVIS W CONTRAST  Result Date: 08/28/2020 CLINICAL DATA:  87 year old female with history of esophageal cancer. Staging examination. EXAM: CT CHEST, ABDOMEN, AND PELVIS WITH CONTRAST TECHNIQUE: Multidetector CT imaging of the chest, abdomen and pelvis was performed following the standard protocol during bolus administration of intravenous contrast. CONTRAST:  38m OMNIPAQUE IOHEXOL 350 MG/ML SOLN COMPARISON:  No priors. FINDINGS: CT CHEST FINDINGS Cardiovascular: Heart size is normal. There is no significant pericardial fluid, thickening or pericardial calcification. There is aortic atherosclerosis, as well as atherosclerosis of the great vessels of the mediastinum and the  coronary arteries, including calcified atherosclerotic plaque in the left main and left anterior descending coronary arteries. Mediastinum/Nodes: Enlarged paraesophageal lymph node measuring 1 cm in short axis (axial image 40 of series 2). No other pathologically enlarged mediastinal or hilar lymph nodes. Mid esophageal mass estimated to measure approximately 2.4 x 2.0 x 3.0 cm (axial image 32 of series 2 and coronal image 32 of series 5). No axillary lymphadenopathy. Lungs/Pleura: Widespread areas of pleuroparenchymal thickening, architectural distortion, cylindrical bronchiectasis and peripheral bronchiolectasis noted in the upper lobes of the lungs bilaterally and the superior aspects of the superior segments of the lower lobes of the lungs bilaterally, most compatible with chronic post infectious or inflammatory scarring. Innumerable tiny 1-2 mm pulmonary nodules noted throughout the periphery of the lungs, particularly in the upper lungs, most compatible with areas of chronic mucoid impaction within terminal bronchioles. No other larger more suspicious appearing pulmonary nodules or masses are noted to suggest the presence of metastatic disease. No acute consolidative airspace disease. No pleural effusions. Musculoskeletal: There are no aggressive appearing lytic or blastic lesions noted in the visualized portions of the skeleton. CT ABDOMEN PELVIS FINDINGS Hepatobiliary: Subcentimeter low-attenuation lesions in the liver (axial image 51 of series 2), incompletely characterized due to their small size. No intra or extrahepatic biliary ductal dilatation. Gallbladder is normal in appearance. Pancreas: No pancreatic mass. No pancreatic ductal dilatation. No pancreatic or peripancreatic fluid collections or inflammatory changes. Spleen: Unremarkable. Adrenals/Urinary Tract: Subcentimeter low-attenuation lesion in the lower pole of the right kidney, too small to characterize, but statistically likely to represent a  tiny cyst. Left kidney and bilateral adrenal glands are normal in appearance. No hydroureteronephrosis. Urinary bladder is normal in appearance. Stomach/Bowel: There is some mural thickening in the proximal stomach (best appreciated on axial image 51 of series 2) which is separated from the esophageal mass. Distal aspects of the stomach are normal. No pathologic dilatation of small bowel or colon. Numerous colonic diverticulae are noted, without surrounding inflammatory changes to suggest an acute diverticulitis at this time. The appendix is not confidently identified and may be surgically absent. Regardless, there are no inflammatory changes noted adjacent to the cecum to suggest the presence of an acute appendicitis at this time. Vascular/Lymphatic: Aortic atherosclerosis, without evidence of aneurysm or dissection in the abdominal or pelvic vasculature. No lymphadenopathy noted in the abdomen or pelvis. Reproductive: Uterus and right ovary are unremarkable in appearance. In the left adnexa (axial image 103 of series 2 and coronal image 35 of series 5) there  is a large 3.9 x 3.2 x 4.4 cm intermediate attenuation mass (48 HU). Other: No significant volume of ascites.  No pneumoperitoneum. Musculoskeletal: There are no aggressive appearing lytic or blastic lesions noted in the visualized portions of the skeleton. IMPRESSION: 1. Large mass in the mid esophagus with adjacent enhancing enlarged middle mediastinal lymph node highly concerning for local nodal metastasis. 2. Mass-like thickening in the proximal aspect of the stomach, geographically separate from the previously described esophageal mass. A second gastric neoplasm is difficult to exclude, and attention at time of endoscopy is suggested. 3. Large intermediate attenuation mass in the left adnexa, highly abnormal in a postmenopausal female. Further evaluation with nonemergent pelvic ultrasound is recommended in the near future to better characterize this  finding, as ovarian malignancy is not excluded. 4. Aortic atherosclerosis, in addition to left main and left anterior descending coronary artery disease. 5. 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 6. Colonic diverticulosis without evidence of acute diverticulitis at this time. 7. Additional incidental findings, as above. Electronically Signed   By: Vinnie Langton M.D.   On: 08/28/2020 15:35   NM PET Image Initial (PI) Skull Base To Thigh  Result Date: 09/18/2020 CLINICAL DATA:  Initial treatment strategy for esophageal cancer. EXAM: NUCLEAR MEDICINE PET SKULL BASE TO THIGH TECHNIQUE: 4.78 mCi F-18 FDG was injected intravenously. Full-ring PET imaging was performed from the skull base to thigh after the radiotracer. CT data was obtained and used for attenuation correction and anatomic localization. Fasting blood glucose: 120 mg/dl COMPARISON:  08/27/2020 FINDINGS: Mediastinal blood pool activity: SUV max 2.2 Liver activity: SUV max NA NECK: No hypermetabolic lymph nodes in the neck. Incidental CT findings: none CHEST: Intense metabolic activity associated with esophageal thickening inferior to the carina. Hypermetabolic esophageal thickening occurs over approximately 3 cm segment and is intense with SUV max equal 18.8. Enlarged lymph node along the esophagus inferior to the mass measures 14 mm (image 84/4) in has moderate metabolic activity with SUV max equal 3.7. More superiorly at the level of the takeoff of the LEFT mainstem bronchus there is a hypermetabolic node with SUV max equal 4.5 on image 70. Hypermetabolic lymph node in the high RIGHT paratracheal nodal station measuring 6 mm SUV max equal 4.2 (image 47). Finally asymmetric hypermetabolic activity in the LEFT hilum with SUV max equal 4.4. No clear lesion identified. Incidental CT findings: Bilateral chronic fibrotic change at the lung apices. No suspicious pulmonary nodules. ABDOMEN/PELVIS: No abnormal activity in  the stomach. Single hypermetabolic gastrohepatic ligament node SUV max equal 3.6 measuring 4 mm image 101. No abnormal activity liver. No hypermetabolic retroperitoneal nodes or pelvic nodes. The previous described LEFT adnexal mass has no associated metabolic activity in stable in size at 4.9 cm. Incidental CT findings: none SKELETON: No focal hypermetabolic activity to suggest skeletal metastasis. Incidental CT findings: none IMPRESSION: 1. Hypermetabolic lesion in the subcarinal esophagus consistent primary esophageal carcinoma. 2. Several mildly metabolic mediastinal lymph nodes are concerning for mediastinal nodal metastasis. 3. Single hypermetabolic gastrohepatic ligament node is indeterminate. 4. No evidence of malignancy within the stomach. No liver metastasis. Electronically Signed   By: Suzy Bouchard M.D.   On: 09/18/2020 13:38       IMPRESSION/PLAN: 1. 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 Dr. Lisbeth Renshaw discusses the pathology findings and reviews the nature of locally advanced esophagus cancer. Dr. Lisbeth Renshaw discusses the options of definitive radiotherapy without chemosensitization versus palliative radiotherapy versus no therapy.  We discussed the risks, benefits, short, and long term effects of radiotherapy, as well as the differing intents. The patient is interested in proceeding with 5 1/2 weeks of definitive treatment. Written consent is obtained and placed in the chart, a copy was provided to the patient. She will simulate tomorrow.  2. Constipation. We discussed milk of magnesia BID until stools are moving and are soft, and then transitioning to miralax daily, prn.  3. Weight loss. The patient has lost between 5-10 pounds but is not in favor of enteral feeing. We discussed this in terms of goals of care and quality of life.   In a visit lasting 60 minutes, greater than 50% of the time was spent face to face discussing the patient's condition, in preparation for  the discussion, and coordinating the patient's care.   The above documentation reflects my direct findings during this shared patient visit. Please see the separate note by Dr. Lisbeth Renshaw on this date for the remainder of the patient's plan of care.    Carola Rhine, [facility]   **Disclaimer: This note was dictated with voice recognition software. Similar sounding words can inadvertently be transcribed and this note may contain transcription errors which may not have been corrected upon publication of note.**

## 2020-09-22 NOTE — Progress Notes (Signed)
[facility] Psychosocial Distress Screening Clinical Social Work  Clinical Social Work was referred by distress screening protocol.   Physical problems indicated as primary cause of distress.  Patients concerns to be addressed by provider.  Please contact CSW if additional needs or concerns are identified.      ONCBCN DISTRESS SCREENING 09/22/2020  Screening Type Initial Screening  Distress experienced in past week (1-10) 5  Physical Problem type Nausea/vomiting;Constipation/diarrhea;Changes in urination  Other Contact via phone   Johnnye Lana, MSW, LCSW, OSW-C Clinical Social Worker [facility] [phone]

## 2020-09-22 NOTE — Progress Notes (Signed)
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 Lorenso Courier  08/27/2020:  CT C/A/P for staging shows some mediastinal lymphadenopathy but no other clear evidence of metastatic disease 09/18/2020: PET CT scan ordered per rad unk request.  Findings are consistent with localized disease with disease in the esophagus and some FDG avid mediastinal lymph nodes  Interval History:  Cheryl Holmes 87 y.o. 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 The patient's last visit was on 08/26/2020. In the interim since the last visit completed staging with a CT scan and PET CT scan.  On exam today Cheryl Holmes is accompanied by a friend of hers.  She notes that she can feel an aching under her right breast.  She notes that when she drinks or eats she feels a "heaviness" in her abdomen.  She notes that she is not having any symptoms consistent with heartburn.  She notes that she does have a sensation of fullness when attempting to eat.  She "knows something is off".  Her weight has been holding steady around 96 pounds.  She notes that she does not wish to undergo feeding tube placement and is skeptical about radiation therapy.  She notes that she has lived a good full life and is unsure that the interventions we would provide will extend her life in a  meaningful way.  She otherwise denies any fevers, chills, sweats, nausea, ming or diarrhea.  A full 10 point ROS is listed below.    MEDICAL HISTORY:  Past Medical History:  Diagnosis Date   Actinic keratosis    Aftercare following surgery of the skin or subcutaneous tissue    Basal cell carcinoma of skin of other parts of face    Bone/cartilage disorder    nose   Neoplasm of uncertain behavior of skin    Screening for malignant neoplasm of the rectum    Thyroid disease     SURGICAL HISTORY: Past Surgical History:  Procedure Laterality Date   APPENDECTOMY  1939   Dr. Griffin Basil [facility]   BACK SURGERY  2005   Dr. Micheal Likens   Cancer Removal Forehead, Basal Cell N/A 11/24/2009   colonoscopy performation  2000   Dr. Maurene Capes, Dr.Weatherly   ESOPHAGOGASTRODUODENOSCOPY (EGD) WITH PROPOFOL N/A 06/06/2018   Procedure: ESOPHAGOGASTRODUODENOSCOPY (EGD) WITH PROPOFOL;  Surgeon: Milus Banister, MD;  Location: [facility];  Service: Endoscopy;  Laterality: N/A;   FOREIGN BODY REMOVAL  06/06/2018   Procedure: FOREIGN BODY REMOVAL;  Surgeon: Milus Banister, MD;  Location: [facility];  Service: Endoscopy;;    SOCIAL HISTORY: Social History   Socioeconomic History   Marital status: Widowed    Spouse name: Not on file   Number of children: 4   Years of education: Not on file   Highest education level: Not on file  Occupational History   Occupation: retired  Tobacco Use   Smoking status: Former    Types: Cigarettes  Smokeless tobacco: Never   Tobacco comments:    Quit in the early 1950s  Vaping Use   Vaping Use: Never used  Substance and Sexual Activity   Alcohol use: Yes   Drug use: No   Sexual activity: Never  Other Topics Concern   Not on file  Social History Narrative   Tobacco use, amount per day now: NONE   Past tobacco use, amount per day: MINIMAL   How many years did you use tobacco: 3 OR 4    Alcohol use (drinks per week): 3 OR 4   Diet: REGULAR   Do you  drink/eat things with caffeine: YES   Marital status:  WIDOWED                                What year were you married? 1951   Do you live in a house, apartment, assisted living, condo, trailer, etc.? APT   Is it one or more stories? YES   How many persons live in your home? A LOT   Do you have pets in your home?( please list) NO   Current or past profession: WORKED [employer]   Do you exercise?     YES                             Type and how often? WALKING- CLASSES   Do you have a living will? YES   Do you have a DNR form?    YES                               If not, do you want to discuss one?   Do you have signed POA/HPOA forms?  YES                      If so, please bring to you appointment   Social Determinants of Health   Financial Resource Strain: Not on file  Food Insecurity: Not on file  Transportation Needs: Not on file  Physical Activity: Not on file  Stress: Not on file  Social Connections: Not on file  Intimate Partner Violence: Not on file    FAMILY HISTORY: Family History  Problem Relation Age of Onset   Kidney disease Mother    Heart disease Father     ALLERGIES:  is allergic to polysporin [bacitracin-polymyxin b] and sulfonamide derivatives.  MEDICATIONS:  Current Outpatient Medications  Medication Sig Dispense Refill   amLODipine (NORVASC) 10 MG tablet Take 1 tablet (10 mg total) by mouth daily. 30 tablet 5   levothyroxine (SYNTHROID) 50 MCG tablet TAKE 1 TABLET BY MOUTH ONCE DAILY BEFORE BREAKFAST 90 tablet 3   losartan (COZAAR) 25 MG tablet Take 1 tablet (25 mg total) by mouth 2 (two) times daily. 60 tablet 2   Current Facility-Administered Medications  Medication Dose Route Frequency Provider Last Rate Last Admin   0.9 %  sodium chloride infusion  500 mL Intravenous Once Irene Shipper, MD        REVIEW OF SYSTEMS:   Constitutional: ( - ) fevers, ( - )  chills , ( - ) night sweats Eyes: ( - ) blurriness of vision, ( - ) double vision, ( - ) watery  eyes Ears, nose, mouth, throat, and face: ( - )  mucositis, ( - ) sore throat Respiratory: ( - ) cough, ( - ) dyspnea, ( - ) wheezes Cardiovascular: ( - ) palpitation, ( - ) chest discomfort, ( - ) lower extremity swelling Gastrointestinal:  ( - ) nausea, ( - ) heartburn, ( - ) change in bowel habits Skin: ( - ) abnormal skin rashes Lymphatics: ( - ) new lymphadenopathy, ( - ) easy bruising Neurological: ( - ) numbness, ( - ) tingling, ( - ) new weaknesses Behavioral/Psych: ( - ) mood change, ( - ) new changes  All other systems were reviewed with the patient and are negative.  PHYSICAL EXAMINATION: ECOG PERFORMANCE STATUS: 1 - Symptomatic but completely ambulatory  Vitals:   09/18/20 1044  BP: (!) 155/56  Pulse: 89  Resp: 16  Temp: (!) 97.3 F (36.3 C)  SpO2: 100%   Filed Weights   09/18/20 1044  Weight: 100 lb 4.8 oz (45.5 kg)    GENERAL: Well-appearing elderly Caucasian female, alert, no distress and comfortable SKIN: skin color, texture, turgor are normal, no rashes or significant lesions EYES: conjunctiva are pink and non-injected, sclera clear LUNGS: clear to auscultation and percussion with normal breathing effort HEART: regular rate & rhythm and no murmurs and no lower extremity edema PSYCH: alert & oriented x 3, fluent speech NEURO: no focal motor/sensory deficits  LABORATORY DATA:  I have reviewed the data as listed CBC Latest Ref Rng & Units 09/18/2020 08/26/2020 06/30/2020  WBC 4.0 - 10.5 K/uL 11.7(H) 11.2(H) 7.5  Hemoglobin 12.0 - 15.0 g/dL 13.9 14.5 14.2  Hematocrit 36.0 - 46.0 % 42.5 44.5 43  Platelets 150 - 400 K/uL 436(H) 324 397    CMP Latest Ref Rng & Units 09/18/2020 08/26/2020 06/30/2020  Glucose 70 - 99 mg/dL 135(H) 114(H) -  BUN 8 - 23 mg/dL '20 15 9  '$ Creatinine 0.44 - 1.00 mg/dL 0.86 0.94 0.7  Sodium 135 - 145 mmol/L 138 142 141  Potassium 3.5 - 5.1 mmol/L 5.2(H) 5.9(H) 4.7  Chloride 98 - 111 mmol/L 100 98 -  CO2 22 - 32 mmol/L 28 29 -  Calcium 8.9  - 10.3 mg/dL 10.9(H) 11.6(H) 10.5  Total Protein 6.5 - 8.1 g/dL 7.5 7.7 -  Total Bilirubin 0.3 - 1.2 mg/dL 0.5 0.7 -  Alkaline Phos 38 - 126 U/L 110 104 -  AST 15 - 41 U/L 15 24 -  ALT 0 - 44 U/L 16 17 -    RADIOGRAPHIC STUDIES: I have personally reviewed the radiological images as listed and agreed with the findings in the report: FDG avid mass in the mid esophagus with faintly FDG avid mediastinal lymph nodes. CT CHEST ABDOMEN PELVIS W CONTRAST  Result Date: 08/28/2020 CLINICAL DATA:  87 year old female with history of esophageal cancer. Staging examination. EXAM: CT CHEST, ABDOMEN, AND PELVIS WITH CONTRAST TECHNIQUE: Multidetector CT imaging of the chest, abdomen and pelvis was performed following the standard protocol during bolus administration of intravenous contrast. CONTRAST:  54m OMNIPAQUE IOHEXOL 350 MG/ML SOLN COMPARISON:  No priors. FINDINGS: CT CHEST FINDINGS Cardiovascular: Heart size is normal. There is no significant pericardial fluid, thickening or pericardial calcification. There is aortic atherosclerosis, as well as atherosclerosis of the great vessels of the mediastinum and the coronary arteries, including calcified atherosclerotic plaque in the left main and left anterior descending coronary arteries. Mediastinum/Nodes: Enlarged paraesophageal lymph node measuring 1 cm in short axis (axial image 40 of series 2). No other pathologically enlarged mediastinal or hilar lymph nodes. Mid esophageal mass  estimated to measure approximately 2.4 x 2.0 x 3.0 cm (axial image 32 of series 2 and coronal image 32 of series 5). No axillary lymphadenopathy. Lungs/Pleura: Widespread areas of pleuroparenchymal thickening, architectural distortion, cylindrical bronchiectasis and peripheral bronchiolectasis noted in the upper lobes of the lungs bilaterally and the superior aspects of the superior segments of the lower lobes of the lungs bilaterally, most compatible with chronic post infectious or  inflammatory scarring. Innumerable tiny 1-2 mm pulmonary nodules noted throughout the periphery of the lungs, particularly in the upper lungs, most compatible with areas of chronic mucoid impaction within terminal bronchioles. No other larger more suspicious appearing pulmonary nodules or masses are noted to suggest the presence of metastatic disease. No acute consolidative airspace disease. No pleural effusions. Musculoskeletal: There are no aggressive appearing lytic or blastic lesions noted in the visualized portions of the skeleton. CT ABDOMEN PELVIS FINDINGS Hepatobiliary: Subcentimeter low-attenuation lesions in the liver (axial image 51 of series 2), incompletely characterized due to their small size. No intra or extrahepatic biliary ductal dilatation. Gallbladder is normal in appearance. Pancreas: No pancreatic mass. No pancreatic ductal dilatation. No pancreatic or peripancreatic fluid collections or inflammatory changes. Spleen: Unremarkable. Adrenals/Urinary Tract: Subcentimeter low-attenuation lesion in the lower pole of the right kidney, too small to characterize, but statistically likely to represent a tiny cyst. Left kidney and bilateral adrenal glands are normal in appearance. No hydroureteronephrosis. Urinary bladder is normal in appearance. Stomach/Bowel: There is some mural thickening in the proximal stomach (best appreciated on axial image 51 of series 2) which is separated from the esophageal mass. Distal aspects of the stomach are normal. No pathologic dilatation of small bowel or colon. Numerous colonic diverticulae are noted, without surrounding inflammatory changes to suggest an acute diverticulitis at this time. The appendix is not confidently identified and may be surgically absent. Regardless, there are no inflammatory changes noted adjacent to the cecum to suggest the presence of an acute appendicitis at this time. Vascular/Lymphatic: Aortic atherosclerosis, without evidence of aneurysm  or dissection in the abdominal or pelvic vasculature. No lymphadenopathy noted in the abdomen or pelvis. Reproductive: Uterus and right ovary are unremarkable in appearance. In the left adnexa (axial image 103 of series 2 and coronal image 35 of series 5) there is a large 3.9 x 3.2 x 4.4 cm intermediate attenuation mass (48 HU). Other: No significant volume of ascites.  No pneumoperitoneum. Musculoskeletal: There are no aggressive appearing lytic or blastic lesions noted in the visualized portions of the skeleton. IMPRESSION: 1. Large mass in the mid esophagus with adjacent enhancing enlarged middle mediastinal lymph node highly concerning for local nodal metastasis. 2. Mass-like thickening in the proximal aspect of the stomach, geographically separate from the previously described esophageal mass. A second gastric neoplasm is difficult to exclude, and attention at time of endoscopy is suggested. 3. Large intermediate attenuation mass in the left adnexa, highly abnormal in a postmenopausal female. Further evaluation with nonemergent pelvic ultrasound is recommended in the near future to better characterize this finding, as ovarian malignancy is not excluded. 4. Aortic atherosclerosis, in addition to left main and left anterior descending coronary artery disease. 5. 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 6. Colonic diverticulosis without evidence of acute diverticulitis at this time. 7. Additional incidental findings, as above. Electronically Signed   By: Vinnie Langton M.D.   On: 08/28/2020 15:35   NM PET Image Initial (PI) Skull Base To Thigh  Result Date: 09/18/2020 CLINICAL DATA:  Initial  treatment strategy for esophageal cancer. EXAM: NUCLEAR MEDICINE PET SKULL BASE TO THIGH TECHNIQUE: 4.78 mCi F-18 FDG was injected intravenously. Full-ring PET imaging was performed from the skull base to thigh after the radiotracer. CT data was obtained and used for attenuation  correction and anatomic localization. Fasting blood glucose: 120 mg/dl COMPARISON:  08/27/2020 FINDINGS: Mediastinal blood pool activity: SUV max 2.2 Liver activity: SUV max NA NECK: No hypermetabolic lymph nodes in the neck. Incidental CT findings: none CHEST: Intense metabolic activity associated with esophageal thickening inferior to the carina. Hypermetabolic esophageal thickening occurs over approximately 3 cm segment and is intense with SUV max equal 18.8. Enlarged lymph node along the esophagus inferior to the mass measures 14 mm (image 84/4) in has moderate metabolic activity with SUV max equal 3.7. More superiorly at the level of the takeoff of the LEFT mainstem bronchus there is a hypermetabolic node with SUV max equal 4.5 on image 70. Hypermetabolic lymph node in the high RIGHT paratracheal nodal station measuring 6 mm SUV max equal 4.2 (image 47). Finally asymmetric hypermetabolic activity in the LEFT hilum with SUV max equal 4.4. No clear lesion identified. Incidental CT findings: Bilateral chronic fibrotic change at the lung apices. No suspicious pulmonary nodules. ABDOMEN/PELVIS: No abnormal activity in the stomach. Single hypermetabolic gastrohepatic ligament node SUV max equal 3.6 measuring 4 mm image 101. No abnormal activity liver. No hypermetabolic retroperitoneal nodes or pelvic nodes. The previous described LEFT adnexal mass has no associated metabolic activity in stable in size at 4.9 cm. Incidental CT findings: none SKELETON: No focal hypermetabolic activity to suggest skeletal metastasis. Incidental CT findings: none IMPRESSION: 1. Hypermetabolic lesion in the subcarinal esophagus consistent primary esophageal carcinoma. 2. Several mildly metabolic mediastinal lymph nodes are concerning for mediastinal nodal metastasis. 3. Single hypermetabolic gastrohepatic ligament node is indeterminate. 4. No evidence of malignancy within the stomach. No liver metastasis. Electronically Signed   By:  Suzy Bouchard M.D.   On: 09/18/2020 13:38    ASSESSMENT & PLAN Cheryl Holmes 87 y.o. 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   After review of the labs, review of the records, and discussion with the patient the patients findings are most consistent with a squamous cell carcinoma of the esophagus.  He has completed PET CT scan as well as CT scan of the chest abdomen pelvis which show findings concerning for localized disease.  She notes that she does not wish to have a PEG tube placed and would not consider surgery or chemotherapy.  She is willing to speak with radiation oncology next week and I would encourage her to keep an open mind about the benefits of palliative radiation.  She notes that she is 87 years old and that she does not believe that these measures to extend her life would be worth the side effects of intervention.  Whichever route the patient decides to choose I will respect her wishes and provide supportive care moving forward.  # Squamous Cell Carcinoma of the Esophagus, Localized --CT scan of the chest abdomen pelvis and PET CT scan show localized disease --Patient declines to have a PEG placed in order to help assist with nutrition. --I do not believe the patient to be a surgical candidate at this time.  Given her advanced age I would also be concerned about administration of chemotherapy.   -- Patient has a visit with radiation oncology next week, though she notes  that she is considering taking a comfort based approach. --Given her advanced age I do believe that a comfort based approach would be reasonable as she does not wish to undergo aggressive interventions. --We will continue to be available for the patient on an as-needed basis for medication refills or symptom management.  If she were to want to have a PEG tube placed at a later time we will certainly reassess her for  this.  No orders of the defined types were placed in this encounter.   All questions were answered. The patient knows to call the clinic with any problems, questions or concerns.  A total of more than 30 minutes were spent on this encounter with face-to-face time and non-face-to-face time, including preparing to see the patient, ordering tests and/or medications, counseling the patient and coordination of care as outlined above.   Ledell Peoples, MD Department of Hematology/Oncology [place] [facility] Phone: [phone] Pager: [phone] Email: [email or web address]  09/22/2020 9:48 AM

## 2020-09-23 ENCOUNTER — Ambulatory Visit
Admission: RE | Admit: 2020-09-23 | Discharge: 2020-09-23 | Disposition: A | Discharge status: Home / Self Care | Payer: Medicare Other | Source: Ambulatory Visit | Attending: Radiation Oncology | Admitting: Radiation Oncology

## 2020-09-23 ENCOUNTER — Other Ambulatory Visit: Payer: Self-pay

## 2020-09-23 DIAGNOSIS — C155 Malignant neoplasm of lower third of esophagus: Secondary | ICD-10-CM | POA: Insufficient documentation

## 2020-09-23 DIAGNOSIS — Z510 Encounter for antineoplastic radiation therapy: Secondary | ICD-10-CM | POA: Insufficient documentation

## 2020-09-24 NOTE — Progress Notes (Signed)
Cheryl Holmes called enquiring as to why her radiation treatments are not starting next week as she was told.  I reached out to [facility] who told me it had to do with the planning and getting the [prescription ready.  I returned Cheryl Stoklosa's call and provided this information.  She verbalized understanding.

## 2020-09-29 ENCOUNTER — Ambulatory Visit (HOSPITAL_COMMUNITY): Payer: Medicare Other

## 2020-09-29 ENCOUNTER — Other Ambulatory Visit (HOSPITAL_COMMUNITY): Payer: Medicare Other

## 2020-10-05 ENCOUNTER — Ambulatory Visit
Admission: RE | Admit: 2020-10-05 | Discharge: 2020-10-05 | Disposition: A | Discharge status: Home / Self Care | Payer: Medicare Other | Source: Ambulatory Visit | Attending: Radiation Oncology | Admitting: Radiation Oncology

## 2020-10-05 ENCOUNTER — Other Ambulatory Visit: Payer: Self-pay

## 2020-10-05 DIAGNOSIS — Z510 Encounter for antineoplastic radiation therapy: Secondary | ICD-10-CM | POA: Diagnosis not present

## 2020-10-05 DIAGNOSIS — C155 Malignant neoplasm of lower third of esophagus: Secondary | ICD-10-CM | POA: Diagnosis not present

## 2020-10-06 ENCOUNTER — Ambulatory Visit
Admission: RE | Admit: 2020-10-06 | Discharge: 2020-10-06 | Disposition: A | Discharge status: Home / Self Care | Payer: Medicare Other | Source: Ambulatory Visit | Attending: Radiation Oncology | Admitting: Radiation Oncology

## 2020-10-06 ENCOUNTER — Other Ambulatory Visit: Payer: Self-pay

## 2020-10-06 DIAGNOSIS — C155 Malignant neoplasm of lower third of esophagus: Secondary | ICD-10-CM | POA: Diagnosis not present

## 2020-10-06 DIAGNOSIS — Z510 Encounter for antineoplastic radiation therapy: Secondary | ICD-10-CM | POA: Diagnosis not present

## 2020-10-07 ENCOUNTER — Ambulatory Visit
Admission: RE | Admit: 2020-10-07 | Discharge: 2020-10-07 | Disposition: A | Discharge status: Home / Self Care | Payer: Medicare Other | Source: Ambulatory Visit | Attending: Radiation Oncology | Admitting: Radiation Oncology

## 2020-10-07 ENCOUNTER — Other Ambulatory Visit: Payer: Self-pay

## 2020-10-07 DIAGNOSIS — C155 Malignant neoplasm of lower third of esophagus: Secondary | ICD-10-CM | POA: Diagnosis not present

## 2020-10-07 DIAGNOSIS — Z510 Encounter for antineoplastic radiation therapy: Secondary | ICD-10-CM | POA: Diagnosis not present

## 2020-10-08 ENCOUNTER — Other Ambulatory Visit: Payer: Self-pay

## 2020-10-08 ENCOUNTER — Ambulatory Visit
Admission: RE | Admit: 2020-10-08 | Discharge: 2020-10-08 | Disposition: A | Discharge status: Home / Self Care | Payer: Medicare Other | Source: Ambulatory Visit | Attending: Radiation Oncology | Admitting: Radiation Oncology

## 2020-10-08 DIAGNOSIS — C155 Malignant neoplasm of lower third of esophagus: Secondary | ICD-10-CM | POA: Diagnosis not present

## 2020-10-09 ENCOUNTER — Ambulatory Visit
Admission: RE | Admit: 2020-10-09 | Discharge: 2020-10-09 | Disposition: A | Discharge status: Home / Self Care | Payer: Medicare Other | Source: Ambulatory Visit | Attending: Radiation Oncology | Admitting: Radiation Oncology

## 2020-10-09 DIAGNOSIS — C155 Malignant neoplasm of lower third of esophagus: Secondary | ICD-10-CM | POA: Diagnosis not present

## 2020-10-09 MED ORDER — SONAFINE EX EMUL
1.0000 "application " | Freq: Once | CUTANEOUS | Status: AC
  Administered 2020-10-09: 1 via TOPICAL

## 2020-10-09 NOTE — Progress Notes (Signed)
Pt here for patient teaching.  Pt given Radiation and You booklet, skin care instructions, and Sonafine.  Reviewed areas of pertinence such as fatigue, hair loss, skin changes, throat changes, cough, and shortness of breath . Pt able to give teach back of to pat skin and use unscented/gentle soap,apply Sonafine bid and avoid applying anything to skin within 4 hours of treatment. Pt verbalizes understanding of information given and will contact nursing with any questions or concerns.     [email or web address]/[email or web address]  Ruthanna Macchia M. Demetrious Rainford RN, BSN       

## 2020-10-13 ENCOUNTER — Other Ambulatory Visit: Payer: Self-pay

## 2020-10-13 ENCOUNTER — Ambulatory Visit
Admission: RE | Admit: 2020-10-13 | Discharge: 2020-10-13 | Disposition: A | Discharge status: Home / Self Care | Payer: Medicare Other | Source: Ambulatory Visit | Attending: Radiation Oncology | Admitting: Radiation Oncology

## 2020-10-13 DIAGNOSIS — C155 Malignant neoplasm of lower third of esophagus: Secondary | ICD-10-CM | POA: Diagnosis not present

## 2020-10-14 ENCOUNTER — Ambulatory Visit
Admission: RE | Admit: 2020-10-14 | Discharge: 2020-10-14 | Disposition: A | Discharge status: Home / Self Care | Payer: Medicare Other | Source: Ambulatory Visit | Attending: Radiation Oncology | Admitting: Radiation Oncology

## 2020-10-14 DIAGNOSIS — C155 Malignant neoplasm of lower third of esophagus: Secondary | ICD-10-CM | POA: Diagnosis not present

## 2020-10-15 ENCOUNTER — Ambulatory Visit
Admission: RE | Admit: 2020-10-15 | Discharge: 2020-10-15 | Disposition: A | Discharge status: Home / Self Care | Payer: Medicare Other | Source: Ambulatory Visit | Attending: Radiation Oncology | Admitting: Radiation Oncology

## 2020-10-15 DIAGNOSIS — C155 Malignant neoplasm of lower third of esophagus: Secondary | ICD-10-CM | POA: Diagnosis not present

## 2020-10-16 ENCOUNTER — Ambulatory Visit
Admission: RE | Admit: 2020-10-16 | Discharge: 2020-10-16 | Disposition: A | Discharge status: Home / Self Care | Payer: Medicare Other | Source: Ambulatory Visit | Attending: Radiation Oncology | Admitting: Radiation Oncology

## 2020-10-16 ENCOUNTER — Inpatient Hospital Stay: Payer: Medicare Other | Attending: Hematology and Oncology | Admitting: Dietician

## 2020-10-16 ENCOUNTER — Other Ambulatory Visit: Payer: Self-pay

## 2020-10-16 DIAGNOSIS — C155 Malignant neoplasm of lower third of esophagus: Secondary | ICD-10-CM | POA: Diagnosis not present

## 2020-10-16 NOTE — Progress Notes (Signed)
Nutrition   Patient did not show for weekly scheduled nutrition follow-up today.

## 2020-10-19 ENCOUNTER — Ambulatory Visit
Admission: RE | Admit: 2020-10-19 | Discharge: 2020-10-19 | Disposition: A | Discharge status: Home / Self Care | Payer: Medicare Other | Source: Ambulatory Visit | Attending: Radiation Oncology | Admitting: Radiation Oncology

## 2020-10-19 ENCOUNTER — Other Ambulatory Visit: Payer: Self-pay

## 2020-10-19 DIAGNOSIS — C155 Malignant neoplasm of lower third of esophagus: Secondary | ICD-10-CM | POA: Diagnosis not present

## 2020-10-20 ENCOUNTER — Ambulatory Visit
Admission: RE | Admit: 2020-10-20 | Discharge: 2020-10-20 | Disposition: A | Discharge status: Home / Self Care | Payer: Medicare Other | Source: Ambulatory Visit | Attending: Radiation Oncology | Admitting: Radiation Oncology

## 2020-10-20 DIAGNOSIS — C155 Malignant neoplasm of lower third of esophagus: Secondary | ICD-10-CM | POA: Diagnosis not present

## 2020-10-21 ENCOUNTER — Ambulatory Visit
Admission: RE | Admit: 2020-10-21 | Discharge: 2020-10-21 | Disposition: A | Discharge status: Home / Self Care | Payer: Medicare Other | Source: Ambulatory Visit | Attending: Radiation Oncology | Admitting: Radiation Oncology

## 2020-10-21 ENCOUNTER — Other Ambulatory Visit: Payer: Self-pay

## 2020-10-21 DIAGNOSIS — C155 Malignant neoplasm of lower third of esophagus: Secondary | ICD-10-CM | POA: Diagnosis not present

## 2020-10-22 ENCOUNTER — Ambulatory Visit
Admission: RE | Admit: 2020-10-22 | Discharge: 2020-10-22 | Disposition: A | Discharge status: Home / Self Care | Payer: Medicare Other | Source: Ambulatory Visit | Attending: Radiation Oncology | Admitting: Radiation Oncology

## 2020-10-22 DIAGNOSIS — C155 Malignant neoplasm of lower third of esophagus: Secondary | ICD-10-CM | POA: Diagnosis not present

## 2020-10-23 ENCOUNTER — Telehealth: Payer: Self-pay | Admitting: Dietician

## 2020-10-23 ENCOUNTER — Inpatient Hospital Stay: Payer: Medicare Other | Admitting: Dietician

## 2020-10-23 ENCOUNTER — Other Ambulatory Visit: Payer: Self-pay

## 2020-10-23 ENCOUNTER — Ambulatory Visit
Admission: RE | Admit: 2020-10-23 | Discharge: 2020-10-23 | Disposition: A | Discharge status: Home / Self Care | Payer: Medicare Other | Source: Ambulatory Visit | Attending: Radiation Oncology | Admitting: Radiation Oncology

## 2020-10-23 DIAGNOSIS — C155 Malignant neoplasm of lower third of esophagus: Secondary | ICD-10-CM | POA: Diagnosis not present

## 2020-10-23 NOTE — Telephone Encounter (Signed)
Nutrition Follow-up:  Patient receiving radiation therapy for esophageal cancer.   Patient did not show for nutrition follow-up today. Called patient this afternoon, she reports she was unaware of appointments and agreeable to phone visit. Patient reports drinking [employer] [place], CIB, and Ensure Complete. She picks 2 of these to drink and consumes home made bone broth everyday. Patient reports her daughter goes to the [place] market to get the bones and makes this for her. Patient reports she eats ice cream right out of the box and sometimes has [employer] milkshakes.  Patient reports unable to drink much at one time, says she is drinking and sipping all day long. Patient reports having faint nausea, says she does not take any medications. Patient reports some fatigue, but continues to walk 3 flights of stairs to her apartment, able to complete ADL's without difficulty and has friends she likes to visit. Patient does not want to increase supplements, says her weights are stable and feels she is doing fine. Patient agreeable to nutrition follow-up as scheduled next week but would prefer not to have weekly follow-ups.    Medications: reviewed  Labs: reviewed   Anthropometrics: Weight 97.8 lbs today stable. Patient weighed 98.2 lb on 9/9 as well as on 9/2   NUTRITION DIAGNOSIS: Unintended weight loss stable   INTERVENTION:  Continue consuming small amounts of high calorie, high protein easy to swallow foods frequently through out the day Encouraged drinking 3 nutrition supplements daily, suggested adding supplement to ice cream for high calorie, high protein shake Provided suggestions for nausea (ginger/peppermint hard candies) - pt does not take medications   MONITORING, EVALUATION, GOAL: weight trends, intake   NEXT VISIT: Tuesday September 20 in clinic with Pamala Hurry (pt aware)

## 2020-10-26 ENCOUNTER — Encounter: Payer: Self-pay | Admitting: Adult Health

## 2020-10-26 ENCOUNTER — Ambulatory Visit
Admission: RE | Admit: 2020-10-26 | Discharge: 2020-10-26 | Disposition: A | Discharge status: Home / Self Care | Payer: Medicare Other | Source: Ambulatory Visit | Attending: Radiation Oncology | Admitting: Radiation Oncology

## 2020-10-26 ENCOUNTER — Other Ambulatory Visit: Payer: Self-pay

## 2020-10-26 DIAGNOSIS — C155 Malignant neoplasm of lower third of esophagus: Secondary | ICD-10-CM | POA: Diagnosis not present

## 2020-10-27 ENCOUNTER — Inpatient Hospital Stay: Payer: Medicare Other | Admitting: Dietician

## 2020-10-27 ENCOUNTER — Other Ambulatory Visit: Payer: Self-pay

## 2020-10-27 ENCOUNTER — Ambulatory Visit
Admission: RE | Admit: 2020-10-27 | Discharge: 2020-10-27 | Disposition: A | Discharge status: Home / Self Care | Payer: Medicare Other | Source: Ambulatory Visit | Attending: Radiation Oncology | Admitting: Radiation Oncology

## 2020-10-27 DIAGNOSIS — C155 Malignant neoplasm of lower third of esophagus: Secondary | ICD-10-CM | POA: Diagnosis not present

## 2020-10-27 MED ORDER — LOSARTAN POTASSIUM 25 MG PO TABS: 25.0000 mg | ORAL_TABLET | Freq: Two times a day (BID) | ORAL | 5 refills | Status: DC

## 2020-10-27 NOTE — Progress Notes (Signed)
Nutrition Follow-up:  Patient receiving radiation therapy for esophageal cancer.   Met with patient and daughter in clinic this afternoon. Patient reports tolerating radiation, denies nutrition impact symptoms other than increased fatigue. Patient drank CIB this morning, daughter has brought [employer]. Patient will drink this when she gets home. She continues to walk up 3 flights of stairs to her apartment, reports doing laundry this morning. She plans to take a nap this afternoon. There is a piano concert this evening at [facility] she may attend. Patient reports she is doing well and does not feel weekly nutrition follow-ups are needed. Patient and daughter appreciative of nutrition services provided, she agrees to contact if nutrition concerns arise.    Medications: reviewed  Labs: reviewed  Anthropometrics: No new weights for review. Patient weight 97.8 lb on 9/16 stable. Patient weighed 98.2 lb on 9/9 as well as on 9/2   NUTRITION DIAGNOSIS: Unintended weight loss stable  INTERVENTION:  Continue consuming high calorie, high protein supplements for weight maintenance Suggested mixing CIB with whole fat [employer] milk for added protein Patient has contact information, encouraged to contact as needed     MONITORING, EVALUATION, GOAL: weight trends, intake   NEXT VISIT: f/u Thursday October 6 in clinic after final radiation therapy

## 2020-10-28 ENCOUNTER — Ambulatory Visit
Admission: RE | Admit: 2020-10-28 | Discharge: 2020-10-28 | Disposition: A | Discharge status: Home / Self Care | Payer: Medicare Other | Source: Ambulatory Visit | Attending: Radiation Oncology | Admitting: Radiation Oncology

## 2020-10-28 DIAGNOSIS — C155 Malignant neoplasm of lower third of esophagus: Secondary | ICD-10-CM | POA: Diagnosis not present

## 2020-10-29 ENCOUNTER — Ambulatory Visit
Admission: RE | Admit: 2020-10-29 | Discharge: 2020-10-29 | Disposition: A | Discharge status: Home / Self Care | Payer: Medicare Other | Source: Ambulatory Visit | Attending: Radiation Oncology | Admitting: Radiation Oncology

## 2020-10-29 ENCOUNTER — Other Ambulatory Visit: Payer: Self-pay

## 2020-10-29 DIAGNOSIS — C155 Malignant neoplasm of lower third of esophagus: Secondary | ICD-10-CM | POA: Diagnosis not present

## 2020-10-30 ENCOUNTER — Other Ambulatory Visit: Payer: Self-pay

## 2020-10-30 ENCOUNTER — Ambulatory Visit
Admission: RE | Admit: 2020-10-30 | Discharge: 2020-10-30 | Disposition: A | Discharge status: Home / Self Care | Payer: Medicare Other | Source: Ambulatory Visit | Attending: Radiation Oncology | Admitting: Radiation Oncology

## 2020-10-30 DIAGNOSIS — C155 Malignant neoplasm of lower third of esophagus: Secondary | ICD-10-CM | POA: Diagnosis not present

## 2020-11-02 ENCOUNTER — Other Ambulatory Visit: Payer: Self-pay

## 2020-11-02 ENCOUNTER — Ambulatory Visit
Admission: RE | Admit: 2020-11-02 | Discharge: 2020-11-02 | Disposition: A | Discharge status: Home / Self Care | Payer: Medicare Other | Source: Ambulatory Visit | Attending: Radiation Oncology | Admitting: Radiation Oncology

## 2020-11-02 DIAGNOSIS — C155 Malignant neoplasm of lower third of esophagus: Secondary | ICD-10-CM | POA: Diagnosis not present

## 2020-11-03 ENCOUNTER — Inpatient Hospital Stay: Payer: Medicare Other | Admitting: Nutrition

## 2020-11-03 ENCOUNTER — Other Ambulatory Visit: Payer: Self-pay

## 2020-11-03 ENCOUNTER — Ambulatory Visit
Admission: RE | Admit: 2020-11-03 | Discharge: 2020-11-03 | Disposition: A | Discharge status: Home / Self Care | Payer: Medicare Other | Source: Ambulatory Visit | Attending: Radiation Oncology | Admitting: Radiation Oncology

## 2020-11-03 DIAGNOSIS — C155 Malignant neoplasm of lower third of esophagus: Secondary | ICD-10-CM | POA: Diagnosis not present

## 2020-11-04 ENCOUNTER — Ambulatory Visit
Admission: RE | Admit: 2020-11-04 | Discharge: 2020-11-04 | Disposition: A | Discharge status: Home / Self Care | Payer: Medicare Other | Source: Ambulatory Visit | Attending: Radiation Oncology | Admitting: Radiation Oncology

## 2020-11-04 DIAGNOSIS — C155 Malignant neoplasm of lower third of esophagus: Secondary | ICD-10-CM | POA: Diagnosis not present

## 2020-11-05 ENCOUNTER — Ambulatory Visit
Admission: RE | Admit: 2020-11-05 | Discharge: 2020-11-05 | Disposition: A | Discharge status: Home / Self Care | Payer: Medicare Other | Source: Ambulatory Visit | Attending: Radiation Oncology | Admitting: Radiation Oncology

## 2020-11-05 ENCOUNTER — Other Ambulatory Visit: Payer: Self-pay

## 2020-11-05 DIAGNOSIS — C155 Malignant neoplasm of lower third of esophagus: Secondary | ICD-10-CM | POA: Diagnosis not present

## 2020-11-06 ENCOUNTER — Other Ambulatory Visit: Payer: Self-pay

## 2020-11-06 ENCOUNTER — Ambulatory Visit
Admission: RE | Admit: 2020-11-06 | Discharge: 2020-11-06 | Disposition: A | Discharge status: Home / Self Care | Payer: Medicare Other | Source: Ambulatory Visit | Attending: Radiation Oncology | Admitting: Radiation Oncology

## 2020-11-06 DIAGNOSIS — C155 Malignant neoplasm of lower third of esophagus: Secondary | ICD-10-CM | POA: Diagnosis not present

## 2020-11-08 DIAGNOSIS — C155 Malignant neoplasm of lower third of esophagus: Secondary | ICD-10-CM | POA: Insufficient documentation

## 2020-11-09 ENCOUNTER — Ambulatory Visit: Payer: Medicare Other

## 2020-11-10 ENCOUNTER — Ambulatory Visit: Payer: Medicare Other

## 2020-11-10 ENCOUNTER — Ambulatory Visit
Admission: RE | Admit: 2020-11-10 | Discharge: 2020-11-10 | Disposition: A | Discharge status: Home / Self Care | Payer: Medicare Other | Source: Ambulatory Visit | Attending: Radiation Oncology | Admitting: Radiation Oncology

## 2020-11-10 DIAGNOSIS — C155 Malignant neoplasm of lower third of esophagus: Secondary | ICD-10-CM | POA: Diagnosis not present

## 2020-11-11 ENCOUNTER — Other Ambulatory Visit: Payer: Self-pay

## 2020-11-11 ENCOUNTER — Ambulatory Visit
Admission: RE | Admit: 2020-11-11 | Discharge: 2020-11-11 | Disposition: A | Discharge status: Home / Self Care | Payer: Medicare Other | Source: Ambulatory Visit | Attending: Radiation Oncology | Admitting: Radiation Oncology

## 2020-11-11 ENCOUNTER — Ambulatory Visit: Payer: Medicare Other

## 2020-11-11 DIAGNOSIS — C155 Malignant neoplasm of lower third of esophagus: Secondary | ICD-10-CM | POA: Diagnosis not present

## 2020-11-12 ENCOUNTER — Inpatient Hospital Stay: Payer: Medicare Other | Attending: Hematology and Oncology | Admitting: Nutrition

## 2020-11-12 ENCOUNTER — Ambulatory Visit: Payer: Medicare Other

## 2020-11-12 ENCOUNTER — Ambulatory Visit
Admission: RE | Admit: 2020-11-12 | Discharge: 2020-11-12 | Disposition: A | Discharge status: Home / Self Care | Payer: Medicare Other | Source: Ambulatory Visit | Attending: Radiation Oncology | Admitting: Radiation Oncology

## 2020-11-12 DIAGNOSIS — C155 Malignant neoplasm of lower third of esophagus: Secondary | ICD-10-CM | POA: Diagnosis not present

## 2020-11-12 NOTE — Progress Notes (Signed)
Nutrition follow up for Esophagus cancer completed with patient and daughter. Final radiation scheduled for Oct 7. She is pleased to finish radiation therapy. She is tolerating a liquid diet. She has not tolerated soft solids. Reports a small piece of chicken in a soup caused distress. Patient reports her weight is 96 pounds, decreased from 98.6 pounds on Sept 30. She is drinking CIB, [employer], and Ensure daily and usually totals 3 nutrition shakes. She occasionally will have ice cream or soup. Reports tiring of sweet liquids.  Nutrition Diagnosis: Unintended wt loss continues.  Intervention: Educated to increase oral nutrition supplements to 4 daily. Educated on strategies for altering sweet taste of supplements. Provided samples of oral nutrition supplements. Educated to monitor swallowing ability and increase textures of foods as tolerated.  Monitoring, Evaluation, Goals: Patient will tolerate increased calories and protein to minimize weight loss and support healing.  Next Visit: To be scheduled as needed.

## 2020-11-13 ENCOUNTER — Ambulatory Visit
Admission: RE | Admit: 2020-11-13 | Discharge: 2020-11-13 | Disposition: A | Discharge status: Home / Self Care | Payer: Medicare Other | Source: Ambulatory Visit | Attending: Radiation Oncology | Admitting: Radiation Oncology

## 2020-11-13 ENCOUNTER — Other Ambulatory Visit: Payer: Self-pay

## 2020-11-13 ENCOUNTER — Encounter: Payer: Self-pay | Admitting: Radiation Oncology

## 2020-11-13 DIAGNOSIS — C155 Malignant neoplasm of lower third of esophagus: Secondary | ICD-10-CM | POA: Diagnosis not present

## 2020-11-23 NOTE — Progress Notes (Signed)
                                                                                                                                                             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 She did have esophagitis but was mechanically tolerating liquids at the conclusion of her treatment.  Plan: The patient will receive a call in about one month from the radiation oncology department. She will continue follow up with Dr. Lorenso Courier as well.   ________________________________________________    Carola Rhine, [facility]

## 2020-11-30 ENCOUNTER — Encounter: Payer: Self-pay | Admitting: Hematology and Oncology

## 2020-12-02 ENCOUNTER — Ambulatory Visit: Payer: Medicare Other | Admitting: Hematology and Oncology

## 2020-12-02 ENCOUNTER — Other Ambulatory Visit: Payer: Medicare Other

## 2020-12-08 ENCOUNTER — Ambulatory Visit
Admission: RE | Admit: 2020-12-08 | Discharge: 2020-12-08 | Disposition: A | Discharge status: Home / Self Care | Payer: Medicare Other | Source: Ambulatory Visit | Attending: Radiation Oncology | Admitting: Radiation Oncology

## 2020-12-08 DIAGNOSIS — C155 Malignant neoplasm of lower third of esophagus: Secondary | ICD-10-CM | POA: Insufficient documentation

## 2020-12-08 NOTE — Progress Notes (Signed)
  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 During treatment she did very well with radiotherapy and did not have significant desquamation. She reports she is still really struggling with eating, however this is improved compared to where she was prior to radiation. She describes on occasion feeling like there is something that prevents her from swallowing where small bits of food get stuck in her throat at the level of her tracheal region "adam's apple level" and also when this occurs she has thick mucous that fills the back of her mouth. She describes that it takes her an hour to drink a cup of coffee and a carnation instant breakfast drink each morning, and that for 2 1/2 hours she hears abdominal gurgling and feels very full. She has a history of esophageal webs at the time of her diagnosis and food besor  that was removed endoscopically in 2020 prior to her esophageal cancer. She has only tolerated full liquids, with minimal residue, and is going to try a scrambled egg to see how she's able to tolerate this. She denies pain with swallowing or sharp pain when liquids pass her esophagus. She is still very tired and feels like she's noticed changes in her hearing in the last few weeks as well, where she feels like hearing is muffled like being under water.   Impression/Plan: 1. 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 The patient has been  doing well since completion of radiotherapy. We discussed that we would be happy to continue to follow her as needed, but she will also continue to follow up with Dr. Lorenso Courier in medical oncology, but that I would also ask Dr. [name] and Dr. Ardis Hughs weigh in on her case as well.      Carola Rhine, PAC

## 2020-12-09 DIAGNOSIS — Z961 Presence of intraocular lens: Secondary | ICD-10-CM | POA: Diagnosis not present

## 2020-12-09 DIAGNOSIS — H524 Presbyopia: Secondary | ICD-10-CM | POA: Diagnosis not present

## 2020-12-09 DIAGNOSIS — H353131 Nonexudative age-related macular degeneration, bilateral, early dry stage: Secondary | ICD-10-CM | POA: Diagnosis not present

## 2020-12-09 NOTE — Progress Notes (Signed)
1.  Consider IR placement of gastrostomy tube for nutritional support 2.  Palliative esophageal stenting could possibly be considered.  I will forward this to our advanced therapeutic endoscopist, Dr. Lia Foyer, for his review. Thanks, Dr. [name]

## 2020-12-09 NOTE — Progress Notes (Signed)
Esophageal stenting may be necessary even if there has been treatment effect or if this is residual tumor. Hard to know without performing an endoscopy. I think I would begin with a barium swallow to see how things pass and get a understanding of how significant the area may be in regards to posttreatment dilation and how thin the stream of barium passes through the esophagus. I do not have availability for an EGD in the hospital for the next few weeks but I think that probably will help US guide in consider the role of esophageal stenting if indicated. John, would you want to see her to discuss the possibility of esophageal stenting or do you want her to be seen by me in clinic to discuss things further? If you can go ahead and order the barium swallow that would be helpful. Thanks. GM

## 2020-12-10 ENCOUNTER — Other Ambulatory Visit: Payer: Medicare Other

## 2020-12-10 ENCOUNTER — Other Ambulatory Visit: Payer: Self-pay

## 2020-12-10 ENCOUNTER — Ambulatory Visit: Payer: Medicare Other | Admitting: Hematology and Oncology

## 2020-12-10 ENCOUNTER — Telehealth: Payer: Self-pay | Admitting: Radiation Oncology

## 2020-12-10 DIAGNOSIS — R1310 Dysphagia, unspecified: Secondary | ICD-10-CM

## 2020-12-10 DIAGNOSIS — C159 Malignant neoplasm of esophagus, unspecified: Secondary | ICD-10-CM

## 2020-12-10 NOTE — Telephone Encounter (Signed)
I spoke with the patient today.  She has been able to tolerate additional attempts at soups with some residue.  She is also going to be meeting with GI and having a barium swallow.

## 2020-12-10 NOTE — Progress Notes (Signed)
Spoke with pt and she is aware. Barium swallow ordered and pt knows to expect a phone call from rad scheduling to set up the appt.

## 2020-12-10 NOTE — Progress Notes (Signed)
Linda, 1.  Please reach out to the patient and let her know that radiation oncology reached out to [place] regarding the patient's current difficulties with swallowing. 2.  Please set her up for a barium swallow. 3.  Please let her know that we will be in touch after the barium swallow results are available.  We can discuss available options regarding her swallowing and nutrition. Thanks, Dr. [name]

## 2020-12-10 NOTE — Addendum Note (Signed)
Encounter addended by: Algernon Huxley, RN on: 12/10/2020 4:17 PM  Actions taken: Clinical Note Signed

## 2020-12-16 ENCOUNTER — Other Ambulatory Visit: Payer: Self-pay

## 2020-12-16 ENCOUNTER — Ambulatory Visit (HOSPITAL_COMMUNITY)
Admission: RE | Admit: 2020-12-16 | Discharge: 2020-12-16 | Disposition: A | Discharge status: Home / Self Care | Payer: Medicare Other | Source: Ambulatory Visit | Attending: Internal Medicine | Admitting: Internal Medicine

## 2020-12-16 DIAGNOSIS — C159 Malignant neoplasm of esophagus, unspecified: Secondary | ICD-10-CM | POA: Insufficient documentation

## 2020-12-16 DIAGNOSIS — R1310 Dysphagia, unspecified: Secondary | ICD-10-CM | POA: Diagnosis not present

## 2020-12-16 DIAGNOSIS — K224 Dyskinesia of esophagus: Secondary | ICD-10-CM | POA: Diagnosis not present

## 2020-12-17 ENCOUNTER — Other Ambulatory Visit: Payer: Self-pay

## 2020-12-17 ENCOUNTER — Inpatient Hospital Stay: Payer: Medicare Other | Attending: Hematology and Oncology

## 2020-12-17 ENCOUNTER — Inpatient Hospital Stay (HOSPITAL_BASED_OUTPATIENT_CLINIC_OR_DEPARTMENT_OTHER): Payer: Medicare Other | Admitting: Hematology and Oncology

## 2020-12-17 ENCOUNTER — Other Ambulatory Visit: Payer: Self-pay | Admitting: Hematology and Oncology

## 2020-12-17 VITALS — BP 163/74 | HR 93 | Temp 98.2°F | Resp 18 | Wt 100.6 lb

## 2020-12-17 DIAGNOSIS — L570 Actinic keratosis: Secondary | ICD-10-CM | POA: Insufficient documentation

## 2020-12-17 DIAGNOSIS — Z923 Personal history of irradiation: Secondary | ICD-10-CM | POA: Insufficient documentation

## 2020-12-17 DIAGNOSIS — Z87891 Personal history of nicotine dependence: Secondary | ICD-10-CM | POA: Diagnosis not present

## 2020-12-17 DIAGNOSIS — C154 Malignant neoplasm of middle third of esophagus: Secondary | ICD-10-CM

## 2020-12-17 DIAGNOSIS — K222 Esophageal obstruction: Secondary | ICD-10-CM | POA: Diagnosis not present

## 2020-12-17 DIAGNOSIS — Z85828 Personal history of other malignant neoplasm of skin: Secondary | ICD-10-CM | POA: Insufficient documentation

## 2020-12-17 DIAGNOSIS — T18128A Food in esophagus causing other injury, initial encounter: Secondary | ICD-10-CM | POA: Diagnosis not present

## 2020-12-17 DIAGNOSIS — R1310 Dysphagia, unspecified: Secondary | ICD-10-CM

## 2020-12-17 DIAGNOSIS — I10 Essential (primary) hypertension: Secondary | ICD-10-CM | POA: Diagnosis not present

## 2020-12-17 DIAGNOSIS — C771 Secondary and unspecified malignant neoplasm of intrathoracic lymph nodes: Secondary | ICD-10-CM | POA: Insufficient documentation

## 2020-12-17 DIAGNOSIS — Z79899 Other long term (current) drug therapy: Secondary | ICD-10-CM | POA: Diagnosis not present

## 2020-12-17 DIAGNOSIS — C155 Malignant neoplasm of lower third of esophagus: Secondary | ICD-10-CM | POA: Diagnosis not present

## 2020-12-17 DIAGNOSIS — R1319 Other dysphagia: Secondary | ICD-10-CM

## 2020-12-17 LAB — CBC WITH DIFFERENTIAL (CANCER CENTER ONLY)
Abs Immature Granulocytes: 0.02 10*3/uL (ref 0.00–0.07)
Basophils Absolute: 0 10*3/uL (ref 0.0–0.1)
Basophils Relative: 0 %
Eosinophils Absolute: 0.2 10*3/uL (ref 0.0–0.5)
Eosinophils Relative: 2 %
HCT: 44.6 % (ref 36.0–46.0)
Hemoglobin: 14.9 g/dL (ref 12.0–15.0)
Immature Granulocytes: 0 %
Lymphocytes Relative: 26 %
Lymphs Abs: 1.7 10*3/uL (ref 0.7–4.0)
MCH: 28.8 pg (ref 26.0–34.0)
MCHC: 33.4 g/dL (ref 30.0–36.0)
MCV: 86.1 fL (ref 80.0–100.0)
Monocytes Absolute: 0.6 10*3/uL (ref 0.1–1.0)
Monocytes Relative: 10 %
Neutro Abs: 3.9 10*3/uL (ref 1.7–7.7)
Neutrophils Relative %: 62 %
Platelet Count: 283 10*3/uL (ref 150–400)
RBC: 5.18 MIL/uL — ABNORMAL HIGH (ref 3.87–5.11)
RDW: 14.8 % (ref 11.5–15.5)
WBC Count: 6.4 10*3/uL (ref 4.0–10.5)
nRBC: 0 % (ref 0.0–0.2)

## 2020-12-17 LAB — CMP (CANCER CENTER ONLY)
ALT: 17 U/L (ref 0–44)
AST: 22 U/L (ref 15–41)
Albumin: 3.9 g/dL (ref 3.5–5.0)
Alkaline Phosphatase: 121 U/L (ref 38–126)
Anion gap: 9 (ref 5–15)
BUN: 27 mg/dL — ABNORMAL HIGH (ref 8–23)
CO2: 29 mmol/L (ref 22–32)
Calcium: 10.4 mg/dL — ABNORMAL HIGH (ref 8.9–10.3)
Chloride: 102 mmol/L (ref 98–111)
Creatinine: 0.86 mg/dL (ref 0.44–1.00)
GFR, Estimated: >60 mL/min (ref >60)
Glucose, Bld: 95 mg/dL (ref 70–99)
Potassium: 4.1 mmol/L (ref 3.5–5.1)
Sodium: 140 mmol/L (ref 135–145)
Total Bilirubin: 0.4 mg/dL (ref 0.3–1.2)
Total Protein: 7.2 g/dL (ref 6.5–8.1)

## 2020-12-21 ENCOUNTER — Ambulatory Visit (HOSPITAL_COMMUNITY): Payer: Medicare Other

## 2020-12-23 NOTE — Progress Notes (Signed)
[facility] [phone]   [phone]  PROGRESS NOTE  Patient Care Team: Virgie Dad, MD as PCP - General (Internal Medicine) Katy Apo, MD as Consulting Physician (Ophthalmology) Rolm Bookbinder, MD as Consulting Physician (Dermatology) [name], MD as Consulting Physician (Oncology) Royston Bake, RN as Oncology Nurse Navigator (Oncology)  Hematological/Oncological History # Squamous Cell Carcinoma of the Esophagus, Staging in Process 08/19/2020: EGD performed due to dysphagia, biopsy of distal esophagus consistent with squamous cell carcinoma 08/26/2020: establish care with Dr. Lorenso Courier  08/27/2020:  CT C/A/P for staging shows some mediastinal lymphadenopathy but no other clear evidence of metastatic disease 09/18/2020: PET CT scan ordered per rad unk request.  Findings are consistent with localized disease with disease in the esophagus and some FDG avid mediastinal lymph nodes 10/05/2020-11/13/2020: palliative radiation to esophageal mass  Interval History:  Cheryl Holmes 87 y.o. 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 The patient's last visit was on 09/18/2020. In the interim since the last visit she has completed palliative radiation and has been evaluated by gastroenterology.  On exam today Cheryl Holmes is accompanied by a her daughter.  She notes her swallowing has been getting progressively more difficult.  She reports it is taking longer to try to swallow and that even liquids are becoming progressively more difficult.  She notes that she is on a predominantly liquid diet at this time.  We discussed again options moving forward for nutrition up to and including a PEG tube.  She noted that she did not wish to receive a PEG tube or any other more aggressive interventions.  She would be open to any endoscopic interventions that might temporarily  improve her swallowing.  She does look forward to the holidays with her family and remains insistent that she does not wish to pursue surgery, chemotherapy, or PEG tube.  Given her advanced age I do agree with her comfort based approach moving forward.  She otherwise denies any fevers, chills, sweats, nausea, ming or diarrhea.  A full 10 point ROS is listed below.  MEDICAL HISTORY:  Past Medical History:  Diagnosis Date   Actinic keratosis    Aftercare following surgery of the skin or subcutaneous tissue    Basal cell carcinoma of skin of other parts of face    Bone/cartilage disorder    nose   Esophagus cancer ([place])    Hypertension    Neoplasm of uncertain behavior of skin    Screening for malignant neoplasm of the rectum    Thyroid disease     SURGICAL HISTORY: Past Surgical History:  Procedure Laterality Date   APPENDECTOMY  1939   Dr. Griffin [facility]   BACK SURGERY  2005   Dr. Micheal Likens   Cancer Removal Forehead, Basal Cell N/A 11/24/2009   colonoscopy performation  2000   Dr. Maurene Capes, Dr.Weatherly   ESOPHAGOGASTRODUODENOSCOPY (EGD) WITH PROPOFOL N/A 06/06/2018   Procedure: ESOPHAGOGASTRODUODENOSCOPY (EGD) WITH PROPOFOL;  Surgeon: Milus Banister, MD;  Location: [facility] ENDOSCOPY;  Service: Endoscopy;  Laterality: N/A;   FOREIGN BODY REMOVAL  06/06/2018   Procedure: FOREIGN BODY REMOVAL;  Surgeon: Milus Banister, MD;  Location: [facility] ENDOSCOPY;  Service: Endoscopy;;    SOCIAL HISTORY: Social History   Socioeconomic History   Marital status: Widowed    Spouse name: Not on file   Number of children: 4   Years of education: Not on  file   Highest education level: Not on file  Occupational History   Occupation: retired  Tobacco Use   Smoking status: Former    Types: Cigarettes   Smokeless tobacco: Never   Tobacco comments:    Quit in the early 1950s  Vaping Use   Vaping Use: Never used  Substance and Sexual Activity   Alcohol use: Yes   Drug use: No   Sexual activity:  Never  Other Topics Concern   Not on file  Social History Narrative   Tobacco use, amount per day now: NONE   Past tobacco use, amount per day: MINIMAL   How many years did you use tobacco: 3 OR 4    Alcohol use (drinks per week): 3 OR 4   Diet: REGULAR   Do you drink/eat things with caffeine: YES   Marital status:  WIDOWED                                What year were you married? 1951   Do you live in a house, apartment, assisted living, condo, trailer, etc.? APT   Is it one or more stories? YES   How many persons live in your home? A LOT   Do you have pets in your home?( please list) NO   Current or past profession: WORKED [employer]   Do you exercise?     YES                             Type and how often? WALKING- CLASSES   Do you have a living will? YES   Do you have a DNR form?    YES                               If not, do you want to discuss one?   Do you have signed POA/HPOA forms?  YES                      If so, please bring to you appointment   Social Determinants of Health   Financial Resource Strain: Not on file  Food Insecurity: Not on file  Transportation Needs: Not on file  Physical Activity: Not on file  Stress: Not on file  Social Connections: Not on file  Intimate Partner Violence: Not on file    FAMILY HISTORY: Family History  Problem Relation Age of Onset   Kidney disease Mother    Heart disease Father     ALLERGIES:  is allergic to polysporin [bacitracin-polymyxin b] and sulfonamide derivatives.  MEDICATIONS:  Current Outpatient Medications  Medication Sig Dispense Refill   amLODipine (NORVASC) 10 MG tablet Take 1 tablet (10 mg total) by mouth daily. 30 tablet 5   levothyroxine (SYNTHROID) 50 MCG tablet TAKE 1 TABLET BY MOUTH ONCE DAILY BEFORE BREAKFAST 90 tablet 3   losartan (COZAAR) 25 MG tablet Take 1 tablet (25 mg total) by mouth 2 (two) times daily. 60 tablet 5   Current Facility-Administered Medications  Medication Dose Route Frequency  Provider Last Rate Last Admin   0.9 %  sodium chloride infusion  500 mL Intravenous Once Irene Shipper, MD        REVIEW OF SYSTEMS:   Constitutional: ( - ) fevers, ( - )  chills , ( - )  night sweats Eyes: ( - ) blurriness of vision, ( - ) double vision, ( - ) watery eyes Ears, nose, mouth, throat, and face: ( - ) mucositis, ( - ) sore throat Respiratory: ( - ) cough, ( - ) dyspnea, ( - ) wheezes Cardiovascular: ( - ) palpitation, ( - ) chest discomfort, ( - ) lower extremity swelling Gastrointestinal:  ( - ) nausea, ( - ) heartburn, ( - ) change in bowel habits Skin: ( - ) abnormal skin rashes Lymphatics: ( - ) new lymphadenopathy, ( - ) easy bruising Neurological: ( - ) numbness, ( - ) tingling, ( - ) new weaknesses Behavioral/Psych: ( - ) mood change, ( - ) new changes  All other systems were reviewed with the patient and are negative.  PHYSICAL EXAMINATION: ECOG PERFORMANCE STATUS: 1 - Symptomatic but completely ambulatory  Vitals:   12/17/20 1357  BP: (!) 163/74  Pulse: 93  Resp: 18  Temp: 98.2 F (36.8 C)  SpO2: 100%   Filed Weights   12/17/20 1357  Weight: 100 lb 9.6 oz (45.6 kg)    GENERAL: Well-appearing elderly Caucasian female, alert, no distress and comfortable SKIN: skin color, texture, turgor are normal, no rashes or significant lesions EYES: conjunctiva are pink and non-injected, sclera clear LUNGS: clear to auscultation and percussion with normal breathing effort HEART: regular rate & rhythm and no murmurs and no lower extremity edema PSYCH: alert & oriented x 3, fluent speech NEURO: no focal motor/sensory deficits  LABORATORY DATA:  I have reviewed the data as listed CBC Latest Ref Rng & Units 12/17/2020 09/18/2020 08/26/2020  WBC 4.0 - 10.5 K/uL 6.4 11.7(H) 11.2(H)  Hemoglobin 12.0 - 15.0 g/dL 14.9 13.9 14.5  Hematocrit 36.0 - 46.0 % 44.6 42.5 44.5  Platelets 150 - 400 K/uL 283 436(H) 324    CMP Latest Ref Rng & Units 12/17/2020 09/18/2020 08/26/2020   Glucose 70 - 99 mg/dL 95 135(H) 114(H)  BUN 8 - 23 mg/dL 27(H) 20 15  Creatinine 0.44 - 1.00 mg/dL 0.86 0.86 0.94  Sodium 135 - 145 mmol/L 140 138 142  Potassium 3.5 - 5.1 mmol/L 4.1 5.2(H) 5.9(H)  Chloride 98 - 111 mmol/L 102 100 98  CO2 22 - 32 mmol/L 29 28 29   Calcium 8.9 - 10.3 mg/dL 10.4(H) 10.9(H) 11.6(H)  Total Protein 6.5 - 8.1 g/dL 7.2 7.5 7.7  Total Bilirubin 0.3 - 1.2 mg/dL 0.4 0.5 0.7  Alkaline Phos 38 - 126 U/L 121 110 104  AST 15 - 41 U/L 22 15 24   ALT 0 - 44 U/L 17 16 17     RADIOGRAPHIC STUDIES: I have personally reviewed the radiological images as listed and agreed with the findings in the report: FDG avid mass in the mid esophagus with faintly FDG avid mediastinal lymph nodes. DG ESOPHAGUS W SINGLE CM (SOL OR THIN BA)  Result Date: 12/16/2020 CLINICAL DATA:  Dysphagia/feeling of food sitting there after swallowing EXAM: ESOPHAGUS/BARIUM SWALLOW/TABLET STUDY TECHNIQUE: Single contrast examination was performed using thin liquid barium. This exam was performed by Rushie Nyhan, NP, and was supervised and interpreted by Maurine Simmering, MD. FLUOROSCOPY TIME:  Radiation Exposure Index (as provided by the fluoroscopic device): 1.5 mGy If the device does not provide the exposure index: Fluoroscopy Time:  1 minute 12 seconds Number of Acquired Images: 3 series 131 images. This includes multiple cine clips. COMPARISON:  NONE. FINDINGS: Cervical esophagram demonstrates laryngeal penetration without evidence of intra tracheal aspiration. There is a prominent cricopharyngeus at  the level of C4-C5. There is no evidence of cervical esophageal diverticulum. There is moderate esophageal dysmotility. There is a focal stricture measuring approximately 1.2 cm in length in the mid esophagus (RF #5, image 1), corresponding to the region of known esophageal tumor. There is a mild delay of distal progression of thin barium through the stricture. IMPRESSION: Focal esophageal stricture measuring  approximately 1.2 cm in length in the mid-esophagus corresponding to the known esophageal tumor. Mild delayed distal progression of thin barium contrast past the stricture. Overall moderate esophageal dysmotility. Electronically Signed   By: Maurine Simmering M.D.   On: 12/16/2020 14:43    ASSESSMENT & PLAN Cheryl Holmes 87 y.o. 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   After review of the labs, review of the records, and discussion with the patient the patients findings are most consistent with a squamous cell carcinoma of the esophagus.  He has completed PET CT scan as well as CT scan of the chest abdomen pelvis which show findings concerning for localized disease.  She notes that she does not wish to have a PEG tube placed and would not consider surgery or chemotherapy.  She underwent palliative radiation with radiation oncology .  She noted previously that she is 87 years old and that she does not believe that these measures to extend her life would be worth the side effects of intervention.  Whichever route the patient decides to choose I will respect her wishes and provide supportive care moving forward. The patient has also connected with [facility], who I defer to regarding recommendations for palliative measures to improve swallowing.   # Squamous Cell Carcinoma of the Esophagus, Localized --CT scan of the chest abdomen pelvis and PET CT scan show localized disease at presentation.  --Patient declines to have a PEG placed in order to help assist with nutrition. She re-emphasized today that she did not want this done.  --I do not believe the patient to be a surgical candidate at this time.  Given her advanced age I would also be concerned about administration of chemotherapy.  She again re-iterated she did not wish to pursue these more aggressive modalities.  -- Patient follows with Rad/Onc and GI. She has  received palliative radiation and is supportive of a  comfort based approach. --Given her advanced age I do believe that a comfort based approach would be reasonable as she does not wish to undergo aggressive interventions. --defer to GI for possible procedures that may alleviate her swallowing issues. I reminded her today that these interventions are temporary and that the mass will eventually grow and worsen her symptoms.  --We will continue to be available for the patient on an as-needed basis for medication refills or symptom management.  If she were to want to have a PEG tube placed at a later time we will certainly reassess her for this.  No orders of the defined types were placed in this encounter.   All questions were answered. The patient knows to call the clinic with any problems, questions or concerns.  A total of more than 30 minutes were spent on this encounter with face-to-face time and non-face-to-face time, including preparing to see the patient, ordering tests and/or medications, counseling the patient and coordination of care as outlined above.   Ledell Peoples, MD Department of Hematology/Oncology [place] at [facility] Phone: [phone] Pager: [phone] Email:  [email or web address]  12/23/2020 7:28 AM

## 2021-01-04 NOTE — Anesthesia Preprocedure Evaluation (Addendum)
Anesthesia Evaluation  Patient identified by MRN, date of birth, ID band Patient awake    Reviewed: Allergy & Precautions, NPO status , Patient's Chart, lab work & pertinent test results  Airway Mallampati: II  TM Distance: >3 FB     Dental   Pulmonary former smoker,    breath sounds clear to auscultation       Cardiovascular hypertension, + Peripheral Vascular Disease   Rhythm:Regular Rate:Normal     Neuro/Psych    GI/Hepatic Neg liver ROS, History noted Dr. Nyoka Cowden   Endo/Other  Hypothyroidism   Renal/GU negative Renal ROS     Musculoskeletal   Abdominal   Peds  Hematology   Anesthesia Other Findings   Reproductive/Obstetrics                            Anesthesia Physical Anesthesia Plan  ASA: 3  Anesthesia Plan: MAC   Post-op Pain Management:    Induction: Intravenous  PONV Risk Score and Plan: 3 and Ondansetron, Propofol infusion and Midazolam  Airway Management Planned: Nasal Cannula and Simple Face Mask  Additional Equipment:   Intra-op Plan:   Post-operative Plan:   Informed Consent: I have reviewed the patients History and Physical, chart, labs and discussed the procedure including the risks, benefits and alternatives for the proposed anesthesia with the patient or authorized representative who has indicated his/her understanding and acceptance.     Dental advisory given  Plan Discussed with: Anesthesiologist and CRNA  Anesthesia Plan Comments:       Anesthesia Quick Evaluation

## 2021-01-05 ENCOUNTER — Ambulatory Visit (HOSPITAL_COMMUNITY)
Admission: RE | Admit: 2021-01-05 | Discharge: 2021-01-05 | Disposition: A | Discharge status: Home / Self Care | Payer: Medicare Other | Attending: Internal Medicine | Admitting: Internal Medicine

## 2021-01-05 ENCOUNTER — Encounter (HOSPITAL_COMMUNITY)
Admission: RE | Disposition: A | Discharge status: Home / Self Care | Payer: Self-pay | Source: Home / Self Care | Attending: Internal Medicine

## 2021-01-05 ENCOUNTER — Ambulatory Visit (HOSPITAL_COMMUNITY): Payer: Medicare Other | Admitting: Certified Registered"

## 2021-01-05 ENCOUNTER — Other Ambulatory Visit: Payer: Self-pay

## 2021-01-05 ENCOUNTER — Ambulatory Visit (HOSPITAL_COMMUNITY): Payer: Medicare Other

## 2021-01-05 ENCOUNTER — Encounter (HOSPITAL_COMMUNITY): Payer: Self-pay | Admitting: Internal Medicine

## 2021-01-05 DIAGNOSIS — I10 Essential (primary) hypertension: Secondary | ICD-10-CM | POA: Insufficient documentation

## 2021-01-05 DIAGNOSIS — E559 Vitamin D deficiency, unspecified: Secondary | ICD-10-CM | POA: Diagnosis not present

## 2021-01-05 DIAGNOSIS — K449 Diaphragmatic hernia without obstruction or gangrene: Secondary | ICD-10-CM | POA: Insufficient documentation

## 2021-01-05 DIAGNOSIS — Z87891 Personal history of nicotine dependence: Secondary | ICD-10-CM | POA: Diagnosis not present

## 2021-01-05 DIAGNOSIS — Q394 Esophageal web: Secondary | ICD-10-CM | POA: Diagnosis not present

## 2021-01-05 DIAGNOSIS — K222 Esophageal obstruction: Secondary | ICD-10-CM

## 2021-01-05 DIAGNOSIS — Y842 Radiological procedure and radiotherapy as the cause of abnormal reaction of the patient, or of later complication, without mention of misadventure at the time of the procedure: Secondary | ICD-10-CM | POA: Insufficient documentation

## 2021-01-05 DIAGNOSIS — E039 Hypothyroidism, unspecified: Secondary | ICD-10-CM | POA: Diagnosis not present

## 2021-01-05 DIAGNOSIS — Z8501 Personal history of malignant neoplasm of esophagus: Secondary | ICD-10-CM | POA: Diagnosis not present

## 2021-01-05 DIAGNOSIS — R1310 Dysphagia, unspecified: Secondary | ICD-10-CM | POA: Insufficient documentation

## 2021-01-05 DIAGNOSIS — R1319 Other dysphagia: Secondary | ICD-10-CM

## 2021-01-05 DIAGNOSIS — I739 Peripheral vascular disease, unspecified: Secondary | ICD-10-CM | POA: Insufficient documentation

## 2021-01-05 HISTORY — PX: BALLOON DILATION: SHX5330

## 2021-01-05 HISTORY — PX: SAVORY DILATION: SHX5439

## 2021-01-05 HISTORY — PX: ESOPHAGOGASTRODUODENOSCOPY (EGD) WITH PROPOFOL: SHX5813

## 2021-01-05 SURGERY — ESOPHAGOGASTRODUODENOSCOPY (EGD) WITH PROPOFOL
Anesthesia: General

## 2021-01-05 MED ORDER — PROPOFOL 500 MG/50ML IV EMUL
INTRAVENOUS | Status: DC | PRN
  Administered 2021-01-05: 100 ug/kg/min via INTRAVENOUS

## 2021-01-05 MED ORDER — PHENYLEPHRINE HCL (PRESSORS) 10 MG/ML IV SOLN
INTRAVENOUS | Status: AC
  Filled 2021-01-05: qty 1

## 2021-01-05 MED ORDER — LACTATED RINGERS IV SOLN: INTRAVENOUS | Status: DC | PRN

## 2021-01-05 MED ORDER — ATROPINE SULFATE 0.4 MG/ML IV SOLN
INTRAVENOUS | Status: AC
  Filled 2021-01-05: qty 1

## 2021-01-05 MED ORDER — PROPOFOL 500 MG/50ML IV EMUL
INTRAVENOUS | Status: AC
  Filled 2021-01-05: qty 50

## 2021-01-05 MED ORDER — LIDOCAINE 2% (20 MG/ML) 5 ML SYRINGE
INTRAMUSCULAR | Status: DC | PRN
  Administered 2021-01-05: 40 mg via INTRAVENOUS

## 2021-01-05 MED ORDER — SODIUM CHLORIDE 0.9 % IV SOLN: INTRAVENOUS | Status: DC

## 2021-01-05 MED ORDER — PROPOFOL 10 MG/ML IV BOLUS
INTRAVENOUS | Status: AC
  Filled 2021-01-05: qty 20

## 2021-01-05 MED ORDER — LACTATED RINGERS IV SOLN: INTRAVENOUS | Status: DC

## 2021-01-05 SURGICAL SUPPLY — 15 items

## 2021-01-05 NOTE — Op Note (Signed)
[facility] Patient Name: Cheryl Holmes Procedure Date: 01/05/2021 MRN: [id number] Attending MD: Docia Chuck. [name] , MD Date of Birth: [birth year] CSN: [id number] Age: 87 Admit Type: Outpatient Procedure:                Upper GI endoscopy with esophageal dilation (Savary                            followed by balloon?"10 mm) Indications:              Therapeutic procedure, Dysphagia Providers:                Docia Chuck. [name], MD, Particia Nearing, RN, Tyna Jaksch                            Technician Referring MD:             Narda Rutherford, MD Medicines:                Monitored Anesthesia Care Complications:            No immediate complications. Estimated Blood Loss:     Estimated blood loss: none. Procedure:                Pre-Anesthesia Assessment:                           - Prior to the procedure, a History and Physical                            was performed, and patient medications and                            allergies were reviewed. The patient's tolerance of                            previous anesthesia was also reviewed. The risks                            and benefits of the procedure and the sedation                            options and risks were discussed with the patient.                            All questions were answered, and informed consent                            was obtained. Prior Anticoagulants: The patient has                            taken no previous anticoagulant or antiplatelet                            agents. ASA Grade Assessment: III - A patient with  severe systemic disease. After reviewing the risks                            and benefits, the patient was deemed in                            satisfactory condition to undergo the procedure.                           After obtaining informed consent, the endoscope was                            passed under direct vision. Throughout the                             procedure, the patient's blood pressure, pulse, and                            oxygen saturations were monitored continuously. The                            GIF-H190 ([id number]) Olympus endoscope was introduced                            through the mouth, and advanced to the second part                            of duodenum. The upper GI endoscopy was                            accomplished without difficulty. The patient                            tolerated the procedure well. 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 At approximately 29 cm from the incisors was       focal stricture consistent with radiation change. No gross tumors. The       diameter of the stricture was approximately 4 mm it would not permit       passage of the endoscope. Subsequently, a Savary guidewire was placed       into the gastric antrum under fluoroscopic control. A 9 mm followed by       10 mm Savary dilators were passed. Moderate resistance. The esophagus       was reexamined. There was in the diameter of the stricture and localized       trauma. Subsequently 10 mm balloon was used to dilate this region. The       endoscope was then able to pass beyond to complete the endoscopic survey.      The esophagus also had a larger caliber stricture in the region of the       gastroesophageal junction.      The stomach was normal, save sliding hiatal hernia.      The examined duodenum was normal.  The cardia and gastric fundus were normal on retroflexion. Impression:               1. 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                           2. 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 Caliber distal esophageal stricture                           4. Hernia, otherwise unremarkable endoscopy Moderate Sedation:      none Recommendation:           1. Patient has a contact number available for                            emergencies. The signs and symptoms of potential                            delayed complications were discussed with the                            patient. Return to normal activities tomorrow.                            Written discharge instructions were provided to the                            patient.                           2. N.p.o. for 2 hours, then clear liquids, then                            resume previous restricted diet.                           3. Continue present medications.                           4. Recommend repeat upper endoscopy with esophageal                            dilation next 1 to 2 weeks. Dr. Blanch Media nurse will  contact you regarding scheduling. Procedure Code(s):        --- Professional ---                           [phone], Esophagogastroduodenoscopy, flexible,                            transoral; with insertion of guide wire followed by                            passage of dilator(s) through esophagus over guide                            wire Diagnosis Code(s):        --- Professional ---                           K22.2, Esophageal obstruction                           R13.10, Dysphagia, unspecified CPT copyright 2019 [employer]. All rights reserved. The codes documented in this report are preliminary and upon coder review may  be revised to meet current compliance requirements. Docia Chuck. [name], MD 01/05/2021 9:45:07 AM This report has been signed electronically. Number of Addenda: 0

## 2021-01-05 NOTE — Discharge Instructions (Signed)
YOU HAD AN ENDOSCOPIC PROCEDURE TODAY: Refer to the procedure report and other information in the discharge instructions given to you for any specific questions about what was found during the examination. If this information does not answer your questions, please call [facility] office at [phone] to clarify.  ° °YOU SHOULD EXPECT: Some feelings of bloating in the abdomen. Passage of more gas than usual. Walking can help get rid of the air that was put into your GI tract during the procedure and reduce the bloating. If you had a lower endoscopy (such as a colonoscopy or flexible sigmoidoscopy) you may notice spotting of blood in your stool or on the toilet paper. Some abdominal soreness may be present for a day or two, also. ° °DIET: Your first meal following the procedure should be a light meal and then it is ok to progress to your normal diet. A half-sandwich or bowl of soup is an example of a good first meal. Heavy or fried foods are harder to digest and may make you feel nauseous or bloated. Drink plenty of fluids but you should avoid alcoholic beverages for 24 hours. If you had a esophageal dilation, please see attached instructions for diet.   ° °ACTIVITY: Your care partner should take you home directly after the procedure. You should plan to take it easy, moving slowly for the rest of the day. You can resume normal activity the day after the procedure however YOU SHOULD NOT DRIVE, use power tools, machinery or perform tasks that involve climbing or major physical exertion for 24 hours (because of the sedation medicines used during the test).  ° °SYMPTOMS TO REPORT IMMEDIATELY: °A gastroenterologist can be reached at any hour. Please call [phone]  for any of the following symptoms:  °Following lower endoscopy (colonoscopy, flexible sigmoidoscopy) °Excessive amounts of blood in the stool  °Significant tenderness, worsening of abdominal pains  °Swelling of the abdomen that is new, acute  °Fever of 100° or  higher  °Following upper endoscopy (EGD, EUS, ERCP, esophageal dilation) °Vomiting of blood or coffee ground material  °New, significant abdominal pain  °New, significant chest pain or pain under the shoulder blades  °Painful or persistently difficult swallowing  °New shortness of breath  °Black, tarry-looking or red, bloody stools ° °FOLLOW UP:  °If any biopsies were taken you will be contacted by phone or by letter within the next 1-3 weeks. Call [phone]  if you have not heard about the biopsies in 3 weeks.  °Please also call with any specific questions about appointments or follow up tests. ° °

## 2021-01-05 NOTE — Anesthesia Postprocedure Evaluation (Signed)
Anesthesia Post Note  Patient: Cheryl Holmes  Procedure(s) Performed: ESOPHAGOGASTRODUODENOSCOPY (EGD) WITH PROPOFOL SAVORY DILATION BALLOON DILATION     Patient location during evaluation: Endoscopy Anesthesia Type: MAC Level of consciousness: awake Pain management: pain level controlled Vital Signs Assessment: post-procedure vital signs reviewed and stable Respiratory status: spontaneous breathing Cardiovascular status: stable Postop Assessment: no apparent nausea or vomiting Anesthetic complications: no   No notable events documented.  Last Vitals:  Vitals:   01/05/21 0942 01/05/21 0944  BP: (!) 165/68 (!) 165/68  Pulse: 76 75  Resp: 16 (!) 21  Temp:    SpO2: 100% 100%    Last Pain:  Vitals:   01/05/21 0713  TempSrc: Oral  PainSc: 0-No pain                 Argus Caraher

## 2021-01-05 NOTE — Anesthesia Procedure Notes (Signed)
Procedure Name: MAC Date/Time: 01/05/2021 8:50 AM Performed by: Eben Burow, CRNA Pre-anesthesia Checklist: Patient identified, Emergency Drugs available, Suction available, Patient being monitored and Timeout performed Oxygen Delivery Method: Simple face mask Placement Confirmation: positive ETCO2

## 2021-01-05 NOTE — Transfer of Care (Signed)
Immediate Anesthesia Transfer of Care Note  Patient: Cheryl Holmes  Procedure(s) Performed: ESOPHAGOGASTRODUODENOSCOPY (EGD) WITH PROPOFOL SAVORY DILATION BALLOON DILATION  Patient Location: PACU  Anesthesia Type:MAC  Level of Consciousness: awake, alert  and patient cooperative  Airway & Oxygen Therapy: Patient Spontanous Breathing and Patient connected to face mask oxygen  Post-op Assessment: Report given to RN and Post -op Vital signs reviewed and stable  Post vital signs: Reviewed and stable  Last Vitals:  Vitals Value Taken Time  BP 147/89 01/05/21 0930  Temp    Pulse 74 01/05/21 0931  Resp 20 01/05/21 0931  SpO2 100 % 01/05/21 0931  Vitals shown include unvalidated device data.  Last Pain:  Vitals:   01/05/21 0713  TempSrc: Oral  PainSc: 0-No pain         Complications: No notable events documented.

## 2021-01-05 NOTE — H&P (Signed)
HISTORY OF PRESENT ILLNESS:  Cheryl Holmes is a 87 y.o. female with esophageal cancer diagnosed July 2022.  Status postradiation therapy.  Ongoing trouble with swallowing.  Postradiation therapy esophagram revealed tight mid esophageal stricture.  Radiation oncology reached out to GI wondering if there was anything else that might be done to help this patient.  She is not interested in the feeding tube.  We discussed this again this morning.  She has been able to maintain her weight with fortified liquids and pured foods.  She is now for upper endoscopy with possible esophageal dilation.  She is high risk given the fact that she has had cancer and radiation therapy.  We discussed this today in detail.  REVIEW OF SYSTEMS:  All non-GI ROS negative.  Past Medical History:  Diagnosis Date   Actinic keratosis    Aftercare following surgery of the skin or subcutaneous tissue    Basal cell carcinoma of skin of other parts of face    Bone/cartilage disorder    nose   Esophagus cancer ([place])    Hypertension    Neoplasm of uncertain behavior of skin    Screening for malignant neoplasm of the rectum    Thyroid disease     Past Surgical History:  Procedure Laterality Date   APPENDECTOMY  1939   Dr. Griffin [facility]   BACK SURGERY  2005   Dr. Micheal Likens   Cancer Removal Forehead, Basal Cell N/A 11/24/2009   colonoscopy performation  2000   Dr. Maurene Capes, Dr.Weatherly   ESOPHAGOGASTRODUODENOSCOPY (EGD) WITH PROPOFOL N/A 06/06/2018   Procedure: ESOPHAGOGASTRODUODENOSCOPY (EGD) WITH PROPOFOL;  Surgeon: Milus Banister, MD;  Location: [facility];  Service: Endoscopy;  Laterality: N/A;   FOREIGN BODY REMOVAL  06/06/2018   Procedure: FOREIGN BODY REMOVAL;  Surgeon: Milus Banister, MD;  Location: [facility];  Service: Endoscopy;;    Social History Cheryl Holmes  reports that she has quit smoking. Her smoking use included cigarettes. She has never used smokeless tobacco. She reports  current alcohol use. She reports that she does not use drugs.  family history includes Heart disease in her father; Kidney disease in her mother.  Allergies  Allergen Reactions   Polysporin [Bacitracin-Polymyxin B] Anaphylaxis and Rash   Sulfonamide Derivatives Anaphylaxis and Rash    REACTION: hive       PHYSICAL EXAMINATION:  Vital signs: BP (!) 143/65   Pulse 77   Temp 97.9 F (36.6 C)   Resp 20   Ht 5' 3.5" (1.613 m)   Wt 43.1 kg   SpO2 100%   BMI 16.56 kg/m  General: Well-developed, well-nourished, no acute distress HEENT: Sclerae are anicteric, conjunctiva pink. Oral mucosa intact Lungs: Clear Heart: Regular Abdomen: soft, nontender, nondistended, no obvious ascites, no peritoneal signs, normal bowel sounds. No organomegaly. Extremities: No edema Psychiatric: alert and oriented x3. Cooperative     ASSESSMENT:  1.  Esophageal cancer status post radiation therapy.  Residual esophageal stricturing.  Neoplasm versus radiation.   PLAN:  1.  Upper endoscopy with possible esophageal dilation.  High risk as discussed above.The nature of the procedure, as well as the risks, benefits, and alternatives were carefully and thoroughly reviewed with the patient. Ample time for discussion and questions allowed. The patient understood, was satisfied, and agreed to proceed.

## 2021-01-06 ENCOUNTER — Other Ambulatory Visit: Payer: Self-pay

## 2021-01-06 ENCOUNTER — Telehealth: Payer: Self-pay

## 2021-01-06 DIAGNOSIS — K222 Esophageal obstruction: Secondary | ICD-10-CM

## 2021-01-06 DIAGNOSIS — R1310 Dysphagia, unspecified: Secondary | ICD-10-CM

## 2021-01-06 NOTE — Telephone Encounter (Signed)
Pt scheduled for egd with dil in the [facility] 01/20/21 at 11:30am. Pt to arrive here at 10:30am, no solids after midnight clear liquids ok until 6:30am. Detailed mychart message sent regarding appt and instructions.

## 2021-01-06 NOTE — Telephone Encounter (Signed)
-----   Message from Irene Shipper, MD sent at 01/06/2021 12:31 PM EST ----- Regarding: Schedule EGD Vaughan Basta, Please schedule this patient for EGD with dilation in the [facility] on Wednesday, December 14 at 11:30 AM.  Though my schedule is technically full that day, I have plenty of upper endoscopies which will allow [place] to accommodate this patient at that time.  Please touch base with the patient and her daughter Debroah Baller.  Thanks Dr. [name]

## 2021-01-07 ENCOUNTER — Encounter (HOSPITAL_COMMUNITY): Payer: Self-pay | Admitting: Internal Medicine

## 2021-01-11 ENCOUNTER — Encounter: Payer: Self-pay | Admitting: Adult Health

## 2021-01-11 ENCOUNTER — Non-Acute Institutional Stay: Payer: Medicare Other | Admitting: Adult Health

## 2021-01-11 VITALS — BP 128/68 | HR 80 | Temp 96.0°F | Ht 63.5 in | Wt 99.0 lb

## 2021-01-11 DIAGNOSIS — C155 Malignant neoplasm of lower third of esophagus: Secondary | ICD-10-CM | POA: Diagnosis not present

## 2021-01-11 DIAGNOSIS — R1319 Other dysphagia: Secondary | ICD-10-CM | POA: Diagnosis not present

## 2021-01-11 DIAGNOSIS — K222 Esophageal obstruction: Secondary | ICD-10-CM

## 2021-01-11 DIAGNOSIS — E039 Hypothyroidism, unspecified: Secondary | ICD-10-CM

## 2021-01-11 MED ORDER — LEVOTHYROXINE SODIUM 50 MCG PO TABS: ORAL_TABLET | ORAL | 3 refills | Status: DC

## 2021-01-11 NOTE — Progress Notes (Signed)
Location: [facility]  POS:  clinic  Provider:  Cindi Carbon, [place] [id number]   Code Status: DNR Goals of Care:  Advanced Directives 01/05/2021  Does Patient Have a Medical Advance Directive? Yes  Type of Paramedic of Advance;Living will  Does patient want to make changes to medical advance directive? -  Copy of [place] in Chart? Yes - validated most recent copy scanned in chart (See row information)  Pre-existing out of facility DNR order (yellow form or pink MOST form) -     Chief Complaint  Patient presents with   Medical Management of Chronic Issues    Patient returns to the clinic for follow up.      HPI: Patient is a 87 y.o. female seen today for medical management of chronic diseases.  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 She completed radiation therapy. She reports she is trying to eat soft food and is able to maintain her weight. No pain with swallowing. I inquired about nutritional supplements but she became irritated. She says she is doing great and doesn't have any other health concerns.    EGD done on 01/05/21 showed high grade radiation induced stricture She is scheduled for dilation 01/20/21  She has declined peg placement and is not a surgical candidate.   BP is controlled  Weight is up 4 lbs.  From 11/29 Wt Readings from Last 3 Encounters:  01/11/21 99 lb (44.9 kg)  01/05/21 95 lb (43.1 kg)  12/17/20 100 lb 9.6 oz (45.6 kg)    Past Medical History:  Diagnosis Date   Actinic keratosis    Aftercare following surgery of the skin or subcutaneous tissue    Basal cell carcinoma of skin of other parts of face    Bone/cartilage disorder    nose   Esophagus cancer ([place])    Hypertension    Neoplasm of uncertain behavior of skin    Screening for malignant neoplasm of the rectum    Thyroid disease     Past Surgical History:   Procedure Laterality Date   APPENDECTOMY  1939   Dr. Griffin Basil [facility]   BACK SURGERY  2005   Dr. Micheal Likens   BALLOON DILATION N/A 01/05/2021   Procedure: Larrie Kass DILATION;  Surgeon: Irene Shipper, MD;  Location: [facility];  Service: Endoscopy;  Laterality: N/A;   Cancer Removal Forehead, Basal Cell N/A 11/24/2009   colonoscopy performation  2000   Dr. Maurene Capes, Dr.Weatherly   ESOPHAGOGASTRODUODENOSCOPY (EGD) WITH PROPOFOL N/A 06/06/2018   Procedure: ESOPHAGOGASTRODUODENOSCOPY (EGD) WITH PROPOFOL;  Surgeon: Milus Banister, MD;  [facility];  Service: Endoscopy;  Laterality: N/A;   ESOPHAGOGASTRODUODENOSCOPY (EGD) WITH PROPOFOL N/A 01/05/2021   Procedure: ESOPHAGOGASTRODUODENOSCOPY (EGD) WITH PROPOFOL;  Surgeon: Irene Shipper, MD;  [facility];  Service: Endoscopy;  Laterality: N/A;   FOREIGN BODY REMOVAL  06/06/2018   Procedure: FOREIGN BODY REMOVAL;  Surgeon: Milus Banister, MD;  [facility]: [facility];  Service: Endoscopy;;   SAVORY DILATION N/A 01/05/2021   Procedure: Azzie Almas DILATION;  Surgeon: Irene Shipper, MD;  [facility];  Service: Endoscopy;  Laterality: N/A;    Allergies  Allergen Reactions   Polysporin [Bacitracin-Polymyxin B] Anaphylaxis and Rash   Sulfonamide Derivatives Anaphylaxis and Rash    REACTION: hive    Outpatient Encounter Medications as of 01/11/2021  Medication Sig   levothyroxine (SYNTHROID) 50 MCG tablet  TAKE 1 TABLET BY MOUTH ONCE DAILY BEFORE BREAKFAST   Polyethyl Glycol-Propyl Glycol (SYSTANE) 0.4-0.3 % SOLN Place 1 drop into both eyes daily as needed (Dry eyes).   [DISCONTINUED] amLODipine (NORVASC) 10 MG tablet Take 1 tablet (10 mg total) by mouth daily. (Patient not taking: Reported on 12/28/2020)   [DISCONTINUED] levothyroxine (SYNTHROID) 50 MCG tablet TAKE 1 TABLET BY MOUTH ONCE DAILY BEFORE BREAKFAST   [DISCONTINUED] losartan (COZAAR) 25 MG tablet Take 1 tablet (25 mg total) by mouth 2 (two) times daily. (Patient  not taking: Reported on 12/28/2020)   Facility-Administered Encounter Medications as of 01/11/2021  Medication   0.9 %  sodium chloride infusion    Review of Systems:  Review of Systems  Constitutional:  Positive for appetite change. Negative for activity change, chills, diaphoresis, fatigue, fever and unexpected weight change.  HENT:  Positive for trouble swallowing. Negative for congestion.   Respiratory:  Negative for cough, shortness of breath and wheezing.   Cardiovascular:  Negative for chest pain, palpitations and leg swelling.  Gastrointestinal:  Negative for abdominal distention, abdominal pain, constipation and diarrhea.  Genitourinary:  Negative for difficulty urinating and dysuria.  Musculoskeletal:  Negative for arthralgias, back pain, gait problem, joint swelling and myalgias.  Skin:  Negative for wound.  Neurological:  Negative for dizziness, tremors, seizures, syncope, facial asymmetry, speech difficulty, weakness, light-headedness, numbness and headaches.  Psychiatric/Behavioral:  Negative for agitation, behavioral problems and confusion.    Health Maintenance  Topic Date Due   DEXA SCAN  Never done   COVID-19 Vaccine (4 - Booster for Moderna series) 09/01/2020   INFLUENZA VACCINE  09/07/2020   TETANUS/TDAP  10/08/2028   Pneumonia Vaccine 79+ Years old  Completed   Zoster Vaccines- Shingrix  Completed   HPV VACCINES  Aged Out    Physical Exam: Vitals:   01/11/21 1459  BP: 128/68  Pulse: 80  Temp: (!) 96 F (35.6 C)  SpO2: 100%  Weight: 99 lb (44.9 kg)  Height: 5' 3.5" (1.613 m)   Body mass index is 17.26 kg/m. Physical Exam Vitals and nursing note reviewed.  Constitutional:      General: She is not in acute distress.    Appearance: She is not diaphoretic.  HENT:     Head: Normocephalic and atraumatic.     Right Ear: Tympanic membrane and ear canal normal.     Left Ear: Tympanic membrane and ear canal normal.     Nose: Nose normal.      Mouth/Throat:     Mouth: Mucous membranes are moist.     Pharynx: Oropharynx is clear.  Neck:     Vascular: No JVD.  Cardiovascular:     Rate and Rhythm: Normal rate and regular rhythm.     Heart sounds: No murmur heard. Pulmonary:     Effort: Pulmonary effort is normal. No respiratory distress.     Breath sounds: Normal breath sounds. No wheezing.  Abdominal:     General: Abdomen is flat. Bowel sounds are normal. There is no distension.     Palpations: Abdomen is soft.  Musculoskeletal:        General: No swelling, tenderness, deformity or signs of injury.     Cervical back: No rigidity or tenderness.     Right lower leg: No edema.     Left lower leg: No edema.  Lymphadenopathy:     Cervical: No cervical adenopathy.  Skin:    General: Skin is warm and dry.  Neurological:  Mental Status: She is alert and oriented to person, place, and time.    Labs reviewed: Basic Metabolic Panel: Recent Labs    06/30/20 0000 08/26/20 1043 09/18/20 1018 12/17/20 1335  NA 141 142 138 140  K 4.7 5.9* 5.2* 4.1  CL  --  98 100 102  CO2  --  29 28 29   GLUCOSE  --  114* 135* 95  BUN 9 15 20  27*  CREATININE 0.7 0.94 0.86 0.86  CALCIUM 10.5 11.6* 10.9* 10.4*  MG  --  2.4  --   --   TSH 2.40  --   --   --    Liver Function Tests: Recent Labs    08/26/20 1043 09/18/20 1018 12/17/20 1335  AST 24 15 22   ALT 17 16 17   ALKPHOS 104 110 121  BILITOT 0.7 0.5 0.4  PROT 7.7 7.5 7.2  ALBUMIN 4.1 3.5 3.9   No results for input(s): LIPASE, AMYLASE in the last 8760 hours. No results for input(s): AMMONIA in the last 8760 hours. CBC: Recent Labs    08/26/20 1043 09/18/20 1018 12/17/20 1335  WBC 11.2* 11.7* 6.4  NEUTROABS 7.2 7.5 3.9  HGB 14.5 13.9 14.9  HCT 44.5 42.5 44.6  MCV 89.4 87.8 86.1  PLT 324 436* 283   Lipid Panel: Recent Labs    06/30/20 0000  CHOL 217*  HDL 65  LDLCALC 127  TRIG 126   No results found for: HGBA1C  Procedures since last visit: DG C-Arm 1-60  Min-No Report  Result Date: 01/05/2021 Fluoroscopy was utilized by the requesting physician.  No radiographic interpretation.   DG ESOPHAGUS W SINGLE CM (SOL OR THIN BA)  Result Date: 12/16/2020 CLINICAL DATA:  Dysphagia/feeling of food sitting there after swallowing EXAM: ESOPHAGUS/BARIUM SWALLOW/TABLET STUDY TECHNIQUE: Single contrast examination was performed using thin liquid barium. This exam was performed by Rushie Nyhan, NP, and was supervised and interpreted by Maurine Simmering, MD. FLUOROSCOPY TIME:  Radiation Exposure Index (as provided by the fluoroscopic device): 1.5 mGy If the device does not provide the exposure index: Fluoroscopy Time:  1 minute 12 seconds Number of Acquired Images: 3 series 131 images. This includes multiple cine clips. COMPARISON:  NONE. FINDINGS: Cervical esophagram demonstrates laryngeal penetration without evidence of intra tracheal aspiration. There is a prominent cricopharyngeus at the level of C4-C5. There is no evidence of cervical esophageal diverticulum. There is moderate esophageal dysmotility. There is a focal stricture measuring approximately 1.2 cm in length in the mid esophagus (RF #5, image 1), corresponding to the region of known esophageal tumor. There is a mild delay of distal progression of thin barium through the stricture. IMPRESSION: Focal esophageal stricture measuring approximately 1.2 cm in length in the mid-esophagus corresponding to the known esophageal tumor. Mild delayed distal progression of thin barium contrast past the stricture. Overall moderate esophageal dysmotility. Electronically Signed   By: Maurine Simmering M.D.   On: 12/16/2020 14:43    Assessment/Plan  1. Malignant neoplasm of lower third of esophagus (HCC) Has completed radiation but is not a candidate for other treatments   2. Esophageal stricture Following up with Dr. [name] for dilation 12/14  3. Acquired hypothyroidism Lab Results  Component Value Date   TSH 2.40  06/30/2020   Continue Synthroid   4. DYSPHAGIA Trying soft food and nutritional supplements Continue to monitor.   Declined dexa scan Labs/tests ordered:  * No order type specified * TSH CBC BMP lipid prior to apt Next appt:  6  month f/u  Total time 62min:  time greater than 50% of total time spent doing pt counseling and coordination of care

## 2021-01-20 ENCOUNTER — Ambulatory Visit (AMBULATORY_SURGERY_CENTER): Payer: Medicare Other | Admitting: Internal Medicine

## 2021-01-20 ENCOUNTER — Other Ambulatory Visit: Payer: Self-pay

## 2021-01-20 ENCOUNTER — Encounter: Payer: Self-pay | Admitting: Internal Medicine

## 2021-01-20 VITALS — BP 131/67 | HR 76 | Temp 95.3°F | Resp 16 | Ht 63.0 in | Wt 99.0 lb

## 2021-01-20 DIAGNOSIS — K2289 Other specified disease of esophagus: Secondary | ICD-10-CM

## 2021-01-20 DIAGNOSIS — K222 Esophageal obstruction: Secondary | ICD-10-CM

## 2021-01-20 DIAGNOSIS — R1319 Other dysphagia: Secondary | ICD-10-CM | POA: Diagnosis not present

## 2021-01-20 DIAGNOSIS — D001 Carcinoma in situ of esophagus: Secondary | ICD-10-CM | POA: Diagnosis not present

## 2021-01-20 DIAGNOSIS — C159 Malignant neoplasm of esophagus, unspecified: Secondary | ICD-10-CM | POA: Diagnosis not present

## 2021-01-20 DIAGNOSIS — R1310 Dysphagia, unspecified: Secondary | ICD-10-CM | POA: Diagnosis not present

## 2021-01-20 MED ORDER — SODIUM CHLORIDE 0.9 % IV SOLN: 500.0000 mL | Freq: Once | INTRAVENOUS | Status: DC

## 2021-01-20 NOTE — Progress Notes (Signed)
Sedate, gd SR, tolerated procedure well, VSS, report to RN 

## 2021-01-20 NOTE — Progress Notes (Signed)
HISTORY OF PRESENT ILLNESS:  Cheryl Holmes is a 87 y.o. female with esophageal cancer status post radiation therapy presents today for upper endoscopy with esophageal dilation.  She underwent the same procedure 2 weeks ago and was found to have a high-grade radiation-induced mid esophageal stricture.  No interval clinical change  REVIEW OF SYSTEMS:  All non-GI ROS negative. Past Medical History:  Diagnosis Date   Actinic keratosis    Aftercare following surgery of the skin or subcutaneous tissue    Basal cell carcinoma of skin of other parts of face    Bone/cartilage disorder    nose   Esophagus cancer ([place])    Hypertension    Neoplasm of uncertain behavior of skin    Screening for malignant neoplasm of the rectum    Thyroid disease     Past Surgical History:  Procedure Laterality Date   APPENDECTOMY  1939   Dr. Griffin Basil [facility]   BACK SURGERY  2005   Dr. Micheal Likens   BALLOON DILATION N/A 01/05/2021   Procedure: Larrie Kass DILATION;  Surgeon: Irene Shipper, MD;  [facility];  Service: Endoscopy;  Laterality: N/A;   Cancer Removal Forehead, Basal Cell N/A 11/24/2009   colonoscopy performation  2000   Dr. Maurene Capes, Dr.Weatherly   ESOPHAGOGASTRODUODENOSCOPY (EGD) WITH PROPOFOL N/A 06/06/2018   Procedure: ESOPHAGOGASTRODUODENOSCOPY (EGD) WITH PROPOFOL;  Surgeon: Milus Banister, MD;  [facility];  Service: Endoscopy;  Laterality: N/A;   ESOPHAGOGASTRODUODENOSCOPY (EGD) WITH PROPOFOL N/A 01/05/2021   Procedure: ESOPHAGOGASTRODUODENOSCOPY (EGD) WITH PROPOFOL;  Surgeon: Irene Shipper, MD;  [facility];  Service: Endoscopy;  Laterality: N/A;   FOREIGN BODY REMOVAL  06/06/2018   Procedure: FOREIGN BODY REMOVAL;  Surgeon: Milus Banister, MD;  [facility];  Service: Endoscopy;;   SAVORY DILATION N/A 01/05/2021   Procedure: Azzie Almas DILATION;  Surgeon: Irene Shipper, MD;  [facility];  Service: Endoscopy;  Laterality: N/A;    Social  History Nhu C Toto  reports that she has quit smoking. Her smoking use included cigarettes. She has never used smokeless tobacco. She reports current alcohol use. She reports that she does not use drugs.  family history includes Heart disease in her father; Kidney disease in her mother.  Allergies  Allergen Reactions   Polysporin [Bacitracin-Polymyxin B] Anaphylaxis and Rash   Sulfonamide Derivatives Anaphylaxis and Rash    REACTION: hive       PHYSICAL EXAMINATION:  Vital signs: BP 139/67    Pulse 74    Temp (!) 95.3 F (35.2 C)    Ht 5\' 3"  (1.6 m)    Wt 99 lb (44.9 kg)    SpO2 100%    BMI 17.54 kg/m  General: Well-developed, well-nourished, no acute distress HEENT: Sclerae are anicteric, conjunctiva pink. Oral mucosa intact Lungs: Clear Heart: Regular Abdomen: soft, nontender, nondistended, no obvious ascites, no peritoneal signs, normal bowel sounds. No organomegaly. Extremities: No edema Psychiatric: alert and oriented x3. Cooperative     ASSESSMENT:  1.  History of esophageal cancer 2.  Status post radiation therapy 3.  Radiation-induced esophageal stricture 4.  Prominent cricopharyngeus and proximal esophageal web   PLAN:  1.  Upper endoscopy with esophageal dilation.  The patient is high risk as we had discussed previously

## 2021-01-20 NOTE — Op Note (Signed)
[name] Procedure Date: 01/20/2021 11:46 AM MRN: [id number] Endoscopist: Docia Chuck. [name] , MD Age: 87 Referring MD:  Date of Birth: March 22, 1926 Gender: Female Account #: [id number] Procedure:                Upper GI endoscopy balloon dilation of esophagus to                            12 mm; biopsies Indications:              Therapeutic procedure, Dysphagia Medicines:                Monitored Anesthesia Care Procedure:                Pre-Anesthesia Assessment:                           - Prior to the procedure, a History and Physical                            was performed, and patient medications and                            allergies were reviewed. The patient's tolerance of                            previous anesthesia was also reviewed. The risks                            and benefits of the procedure and the sedation                            options and risks were discussed with the patient.                            All questions were answered, and informed consent                            was obtained. Prior Anticoagulants: The patient has                            taken no previous anticoagulant or antiplatelet                            agents. ASA Grade Assessment: III - A patient with                            severe systemic disease. After reviewing the risks                            and benefits, the patient was deemed in                            satisfactory condition to undergo the procedure.  After obtaining informed consent, the endoscope was                            passed under direct vision. Throughout the                            procedure, the patient's blood pressure, pulse, and                            oxygen saturations were monitored continuously. The                            Endoscope was introduced through the mouth, and                            advanced to the  second part of duodenum. The upper                            GI endoscopy was accomplished without difficulty.                            The patient tolerated the procedure well. Scope In: Scope Out: Findings:                 There was an esophageal web at 18 cm from the                            incisors. There was a 4 mm nodule at 25 cm. This                            was biopsied. A radiation induced esophageal                            stricture was located at 30 cm and would not permit                            the passage of the standard upper endoscope. A TTS                            dilator was passed through the scope. Dilation with                            a 11-12 mm balloon dilator was performed to 12 mm.                            Biopsies were taken with a cold forceps for                            histology. The endoscope then passed beyond without                            resistance. There was a larger caliber peptic  stricture at 35 cm                           The stomach was normal save sliding hiatal hernia.                           The examined duodenum was normal.                           The cardia and gastric fundus were normal on                            retroflexion. Complications:            No immediate complications. Estimated Blood Loss:     Estimated blood loss: none. Impression:               1. Radiation-induced esophageal stricture balloon                            dilated to 12 mm                           2. Proximal esophageal web and distal esophageal                            peptic stricture as described                           3. 4 mm esophageal nodule at 25 cm. Biopsied                           4. Otherwise unremarkable exam. Recommendation:           1. Patient has a contact number available for                            emergencies. The signs and symptoms of potential                             delayed complications were discussed with the                            patient. Return to normal activities tomorrow.                            Written discharge instructions were provided to the                            patient.                           2. Post dilation diet then resume previous modified                            diet.  3. Continue present medications.                           4. Await pathology results.                           5. Schedule follow-up endoscopy with esophageal                            dilation for Friday, December 23 at 3:30 PM in the                            [facility]. [name], MD 01/20/2021 45:80:99 PM This report has been signed electronically.

## 2021-01-20 NOTE — Progress Notes (Signed)
Called to room to assist during endoscopic procedure.  Patient ID and intended procedure confirmed with present staff. Received instructions for my participation in the procedure from the performing physician.  

## 2021-01-20 NOTE — Patient Instructions (Signed)
Follow dilation diet today, then resume previous modified diet  Await pathology results  Next endoscopy 01-29-21 at 3:30 pm, arrive at 2:30 pm   YOU HAD AN ENDOSCOPIC PROCEDURE TODAY AT [facility]:   Refer to the procedure report that was given to you for any specific questions about what was found during the examination.  If the procedure report does not answer your questions, please call your gastroenterologist to clarify.  If you requested that your care partner not be given the details of your procedure findings, then the procedure report has been included in a sealed envelope for you to review at your convenience later.  YOU SHOULD EXPECT: Some feelings of bloating in the abdomen. Passage of more gas than usual.  Walking can help get rid of the air that was put into your GI tract during the procedure and reduce the bloating.  Please Note:  You might notice some irritation and congestion in your nose or some drainage.  This is from the oxygen used during your procedure.  There is no need for concern and it should clear up in a day or so.  SYMPTOMS TO REPORT IMMEDIATELY:  Following upper endoscopy (EGD)  Vomiting of blood or coffee ground material  New chest pain or pain under the shoulder blades  Painful or persistently difficult swallowing  New shortness of breath  Fever of 100F or higher  Black, tarry-looking stools  For urgent or emergent issues, a gastroenterologist can be reached at any hour by calling [phone]. Do not use MyChart messaging for urgent concerns.    DIET:  follow dilation diet today, then resume previous modified diet.   Drink plenty of fluids but you should avoid alcoholic beverages for 24 hours.  ACTIVITY:  You should plan to take it easy for the rest of today and you should NOT DRIVE or use heavy machinery until tomorrow (because of the sedation medicines used during the test).    FOLLOW UP: Our staff will call the number listed on  your records 48-72 hours following your procedure to check on you and address any questions or concerns that you may have regarding the information given to you following your procedure. If we do not reach you, we will leave a message.  We will attempt to reach you two times.  During this call, we will ask if you have developed any symptoms of COVID 19. If you develop any symptoms (ie: fever, flu-like symptoms, shortness of breath, cough etc.) before then, please call [phone].  If you test positive for Covid 19 in the 2 weeks post procedure, please call and report this information to [place].    If any biopsies were taken you will be contacted by phone or by letter within the next 1-3 weeks.  Please call us at [phone] if you have not heard about the biopsies in 3 weeks.    SIGNATURES/CONFIDENTIALITY: You and/or your care partner have signed paperwork which will be entered into your electronic medical record.  These signatures attest to the fact that that the information above on your After Visit Summary has been reviewed and is understood.  Full responsibility of the confidentiality of this discharge information lies with you and/or your care-partner.

## 2021-01-20 NOTE — Progress Notes (Signed)
Pt's states no medical or surgical changes since previsit or office visit. 

## 2021-01-20 NOTE — Progress Notes (Signed)
C.W. vital signs. 

## 2021-01-22 ENCOUNTER — Telehealth: Payer: Self-pay

## 2021-01-22 ENCOUNTER — Encounter: Payer: Self-pay | Admitting: Internal Medicine

## 2021-01-22 NOTE — Telephone Encounter (Signed)
°  Follow up Call-  Call back number 01/20/2021 08/19/2020  Post procedure Call Back phone  # [phone]  Permission to leave phone message Yes Yes  Some recent data might be hidden     Patient questions:  Do you have a fever, pain , or abdominal swelling? No. Pain Score  0 *  Have you tolerated food without any problems? Yes.    Have you been able to return to your normal activities? Yes.    Do you have any questions about your discharge instructions: Diet   No. Medications  No. Follow up visit  No.  Do you have questions or concerns about your Care? No.  Actions: * If pain score is 4 or above: No action needed, pain <4.

## 2021-01-29 ENCOUNTER — Other Ambulatory Visit: Payer: Self-pay

## 2021-01-29 ENCOUNTER — Ambulatory Visit (AMBULATORY_SURGERY_CENTER): Payer: Medicare Other | Admitting: Internal Medicine

## 2021-01-29 ENCOUNTER — Encounter: Payer: Self-pay | Admitting: Internal Medicine

## 2021-01-29 VITALS — BP 158/65 | HR 90 | Resp 20 | Ht 63.0 in | Wt 99.0 lb

## 2021-01-29 DIAGNOSIS — R1310 Dysphagia, unspecified: Secondary | ICD-10-CM | POA: Diagnosis not present

## 2021-01-29 DIAGNOSIS — K319 Disease of stomach and duodenum, unspecified: Secondary | ICD-10-CM

## 2021-01-29 DIAGNOSIS — C159 Malignant neoplasm of esophagus, unspecified: Secondary | ICD-10-CM

## 2021-01-29 DIAGNOSIS — E039 Hypothyroidism, unspecified: Secondary | ICD-10-CM | POA: Diagnosis not present

## 2021-01-29 DIAGNOSIS — K222 Esophageal obstruction: Secondary | ICD-10-CM | POA: Diagnosis not present

## 2021-01-29 DIAGNOSIS — K2289 Other specified disease of esophagus: Secondary | ICD-10-CM | POA: Diagnosis not present

## 2021-01-29 MED ORDER — SODIUM CHLORIDE 0.9 % IV SOLN: 500.0000 mL | Freq: Once | INTRAVENOUS | Status: DC

## 2021-01-29 NOTE — Op Note (Signed)
[name] Procedure Date: 01/29/2021 1:26 PM MRN: [id number] Endoscopist: Docia Chuck. [name] , MD Age: 87 Referring MD:  Date of Birth: [birth year] Gender: Female Account #: [id number] Procedure:                Upper GI endoscopy with biopsies; balloon dilation                            of the esophagus-38mm Indications:              Therapeutic procedure, Dysphagia. Esophageal cancer                            diagnosed July 2022. Status post radiation therapy.                            Subsequent radiation-induced stricture. Dilated                            January 05, 2021 and January 20, 2021. Last                            dilation to a maximal diameter of 12 mm. She has                            had improvement in swallowing. There was a small                            nodule proximal to the stricture that was biopsied,                            during the last examination, that revealed evidence                            for squamous cell carcinoma Medicines:                Monitored Anesthesia Care Procedure:                Pre-Anesthesia Assessment:                           - Prior to the procedure, a History and Physical                            was performed, and patient medications and                            allergies were reviewed. The patient's tolerance of                            previous anesthesia was also reviewed. The risks                            and benefits of the procedure and the sedation  options and risks were discussed with the patient.                            All questions were answered, and informed consent                            was obtained. Prior Anticoagulants: The patient has                            taken no previous anticoagulant or antiplatelet                            agents. ASA Grade Assessment: III - A patient with                             severe systemic disease. After reviewing the risks                            and benefits, the patient was deemed in                            satisfactory condition to undergo the procedure.                           After obtaining informed consent, the endoscope was                            passed under direct vision. Throughout the                            procedure, the patient's blood pressure, pulse, and                            oxygen saturations were monitored continuously. The                            Endoscope was introduced through the mouth, and                            advanced to the second part of duodenum. The upper                            GI endoscopy was accomplished without difficulty.                            The patient tolerated the procedure well. Scope In: Scope Out: Findings:                 The esophagus revealed a benign ringlike proximal                            web at 20 cm. For the purposes of ring disruption,  biopsies were taken with a cold forceps for                            histology. There was recurrence of the mid                            esophageal radiation-induced stricture which would                            not permit the passage of the standard endoscope                            beyond. Thus, A TTS dilator was passed through the                            scope. Sequential dilation with an 12-20-11 mm                            balloon dilator was performed to 13 mm. The                            endoscope passed beyond thereafter. There was the                            anticipated mucosal rent without obvious                            perforation.                           The stomach was normal, save hiatal hernia.                           The examined duodenum was normal.                           The cardia and gastric fundus were normal on                             retroflexion. Complications:            No immediate complications. Estimated Blood Loss:     Estimated blood loss: none. Impression:               1. Esophageal cancer status post radiation therapy                           2. Radiation-induced esophageal stricture status                            post balloon dilation                           3. Proximal esophageal web, biopsy disrupted.  4. Otherwise unremarkable EGD. Recommendation:           1. Patient has a contact number available for                            emergencies. The signs and symptoms of potential                            delayed complications were discussed with the                            patient. Return to normal activities tomorrow.                            Written discharge instructions were provided to the                            patient.                           2. Post dilation diet and resume modified diet.                           3. Continue present medications.                           4. Await pathology results.                           5. Repeat EGD with dilation in the [place] with Dr.                            [name] in 3 to 4 weeks Docia Chuck. [name], MD 01/29/2021 2:07:21 PM This report has been signed electronically.

## 2021-01-29 NOTE — Patient Instructions (Signed)
Impression/Recommendations:  Post-dilation diet handout given to patient.  Continue present medications. Await pathology results.  Repeat EGD with dilation in the [place] with Dr. [name] in 3-4 weeks.  YOU HAD AN ENDOSCOPIC PROCEDURE TODAY AT [facility]:   Refer to the procedure report that was given to you for any specific questions about what was found during the examination.  If the procedure report does not answer your questions, please call your gastroenterologist to clarify.  If you requested that your care partner not be given the details of your procedure findings, then the procedure report has been included in a sealed envelope for you to review at your convenience later.  YOU SHOULD EXPECT: Some feelings of bloating in the abdomen. Passage of more gas than usual.  Walking can help get rid of the air that was put into your GI tract during the procedure and reduce the bloating. If you had a lower endoscopy (such as a colonoscopy or flexible sigmoidoscopy) you may notice spotting of blood in your stool or on the toilet paper. If you underwent a bowel prep for your procedure, you may not have a normal bowel movement for a few days.  Please Note:  You might notice some irritation and congestion in your nose or some drainage.  This is from the oxygen used during your procedure.  There is no need for concern and it should clear up in a day or so.  SYMPTOMS TO REPORT IMMEDIATELY:  Following upper endoscopy (EGD)  Vomiting of blood or coffee ground material  New chest pain or pain under the shoulder blades  Painful or persistently difficult swallowing  New shortness of breath  Fever of 100F or higher  Black, tarry-looking stools  For urgent or emergent issues, a gastroenterologist can be reached at any hour by calling [phone]. Do not use MyChart messaging for urgent concerns.    DIET:  We do recommend a small meal at first, but then you may proceed to your regular  diet.  Drink plenty of fluids but you should avoid alcoholic beverages for 24 hours.  ACTIVITY:  You should plan to take it easy for the rest of today and you should NOT DRIVE or use heavy machinery until tomorrow (because of the sedation medicines used during the test).    FOLLOW UP: Our staff will call the number listed on your records 48-72 hours following your procedure to check on you and address any questions or concerns that you may have regarding the information given to you following your procedure. If we do not reach you, we will leave a message.  We will attempt to reach you two times.  During this call, we will ask if you have developed any symptoms of COVID 19. If you develop any symptoms (ie: fever, flu-like symptoms, shortness of breath, cough etc.) before then, please call [phone].  If you test positive for Covid 19 in the 2 weeks post procedure, please call and report this information to [place].    If any biopsies were taken you will be contacted by phone or by letter within the next 1-3 weeks.  Please call us at [phone] if you have not heard about the biopsies in 3 weeks.    SIGNATURES/CONFIDENTIALITY: You and/or your care partner have signed paperwork which will be entered into your electronic medical record.  These signatures attest to the fact that that the information above on your After Visit Summary has been reviewed and is understood.  Full responsibility of the confidentiality of this discharge information lies with you and/or your care-partner.

## 2021-01-29 NOTE — Progress Notes (Signed)
Called to room to assist during endoscopic procedure.  Patient ID and intended procedure confirmed with present staff. Received instructions for my participation in the procedure from the performing physician.  

## 2021-01-29 NOTE — Progress Notes (Signed)
Report to PACU, RN, vss, BBS= Clear.  

## 2021-01-29 NOTE — Progress Notes (Signed)
Apptmt for repeat EGD booked prior to D/C.

## 2021-01-29 NOTE — Progress Notes (Signed)
Pt. To return to [facility] in 3-4 weeks for repeat EGD with Dr. [name].  Nothing available for 6-7 weeks.  Dr. [name] plans to find an opening for pt. Procedure not booked at time of D/C.

## 2021-01-29 NOTE — Progress Notes (Signed)
HISTORY OF PRESENT ILLNESS:  Cheryl Holmes is a 87 y.o. female with a history of an assault cancer of the esophagus status post radiation therapy.  Subsequent radiation and stricture.  She is undergone dilation on 2 occasions in the past month.  Last dilation 10 days ago to a maximal diameter of 12 mm.  She is now for repeat dilation with  REVIEW OF SYSTEMS:  All non-GI ROS negative. Past Medical History:  Diagnosis Date   Actinic keratosis    Aftercare following surgery of the skin or subcutaneous tissue    Basal cell carcinoma of skin of other parts of face    Bone/cartilage disorder    nose   Esophagus cancer ([place])    Hypertension    Neoplasm of uncertain behavior of skin    Screening for malignant neoplasm of the rectum    Thyroid disease     Past Surgical History:  Procedure Laterality Date   APPENDECTOMY  1939   Dr. Griffin [facility]   BACK SURGERY  2005   Dr. Micheal Likens   BALLOON DILATION N/A 01/05/2021   Procedure: Larrie Kass DILATION;  Surgeon: Irene Shipper, MD;  Location: [facility];  Service: Endoscopy;  Laterality: N/A;   Cancer Removal Forehead, Basal Cell N/A 11/24/2009   colonoscopy performation  2000   Dr. Maurene Capes, Dr.Weatherly   ESOPHAGOGASTRODUODENOSCOPY (EGD) WITH PROPOFOL N/A 06/06/2018   Procedure: ESOPHAGOGASTRODUODENOSCOPY (EGD) WITH PROPOFOL;  Surgeon: Milus Banister, MD;  Location: [facility];  Service: Endoscopy;  Laterality: N/A;   ESOPHAGOGASTRODUODENOSCOPY (EGD) WITH PROPOFOL N/A 01/05/2021   Procedure: ESOPHAGOGASTRODUODENOSCOPY (EGD) WITH PROPOFOL;  Surgeon: Irene Shipper, MD;  Location: [facility];  Service: Endoscopy;  Laterality: N/A;   FOREIGN BODY REMOVAL  06/06/2018   Procedure: FOREIGN BODY REMOVAL;  Surgeon: Milus Banister, MD;  Location: [facility];  Service: Endoscopy;;   SAVORY DILATION N/A 01/05/2021   Procedure: Azzie Almas DILATION;  Surgeon: Irene Shipper, MD;  Location: [facility];  Service: Endoscopy;  Laterality: N/A;     Social History Nikola C Zito  reports that she has quit smoking. Her smoking use included cigarettes. She has never used smokeless tobacco. She reports current alcohol use. She reports that she does not use drugs.  family history includes Heart disease in her father; Kidney disease in her mother.  Allergies  Allergen Reactions   Polysporin [Bacitracin-Polymyxin B] Anaphylaxis and Rash   Sulfonamide Derivatives Anaphylaxis and Rash    REACTION: hive       PHYSICAL EXAMINATION:  Vital signs: BP (!) 154/69    Pulse 77    Resp 19    Ht 5\' 3"  (1.6 m)    Wt 99 lb (44.9 kg)    SpO2 98%    BMI 17.54 kg/m  General: Well-developed, well-nourished, no acute distress HEENT: Sclerae are anicteric, conjunctiva pink. Oral mucosa intact Lungs: Clear Heart: Regular Abdomen: soft, nontender, nondistended, no obvious ascites, no peritoneal signs, normal bowel sounds. No organomegaly. Extremities: No edema Psychiatric: alert and oriented x3. Cooperative     ASSESSMENT:  1.  Radiation-induced esophageal stricture 2.  History of squamous cell cancer of the esophagus 3.  Proximal esophageal web  PLAN:  1.  EGD with dilation and possible biopsies.

## 2021-02-03 ENCOUNTER — Telehealth: Payer: Self-pay

## 2021-02-03 NOTE — Telephone Encounter (Signed)
°  Follow up Call-  Call back number 01/29/2021 01/20/2021 08/19/2020  Post procedure Call Back phone  # [phone]  Permission to leave phone message Yes Yes Yes  Some recent data might be hidden     Patient questions:  Do you have a fever, pain , or abdominal swelling? No. Pain Score  0 *  Have you tolerated food without any problems? Yes.    Have you been able to return to your normal activities? Yes.    Do you have any questions about your discharge instructions: Diet   No. Medications  No. Follow up visit  No.  Do you have questions or concerns about your Care? No.  Actions: * If pain score is 4 or above: No action needed, pain <4.

## 2021-02-15 ENCOUNTER — Telehealth: Payer: Self-pay | Admitting: *Deleted

## 2021-02-15 ENCOUNTER — Telehealth: Payer: Self-pay | Admitting: Internal Medicine

## 2021-02-15 NOTE — Telephone Encounter (Signed)
Pt got a path letter stating that bx of esophagus showed some cancer cells. Pt requests report/letter be sent to Dr. Lorenso Courier at the cancer center. She has called his office. Report and letter sent to Dr. Lorenso Courier.

## 2021-02-15 NOTE — Telephone Encounter (Signed)
Received vm message from pt. 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  See path report from 01/20/21.  Dr. Scarlette Shorts is her GI MD  She is asking if there is any treatment available.  Please advise.

## 2021-02-15 NOTE — Telephone Encounter (Signed)
Patient called and stated that she had biopsy done on her esophagus and there are small nodules that are showing pre cancer? Seeking advice, please advise

## 2021-02-15 NOTE — Telephone Encounter (Signed)
Received vm message from pt regarding recent EGD biopsy of her esophagus that revealed squamous cell carcinoma (her previous diagnosis) and treatment options. TCT patient

## 2021-02-15 NOTE — Telephone Encounter (Signed)
Called patient and spoke with her.  She called to make sure Dr. Lorenso Courier saw her pathology report of her most recent esophageal biopsy. Advised that he is aware of the results. Pt asked if there was any additional treatments that were not IV. Advised that there would not be any oral treatments for her cancer.  She did ask how she would feel on IV chemo. Reviewed potential side effects with her. Pt states she really is doing ok right now and does not want to feel worse. She voices understanding to the course of her disease  "I will be ok until I'm not ok"  Currently pt uses 3 flights of stairs to get to her apartment and tries to walk a mile daily. She can swallow fairly well. She does not want a feeding tube.   Briefly discussed Hospice with her. She states she will let me know when she is ready for that. Offered a clinic visit with Dr. Lorenso Courier and she declined for now. Advised that she can call at any time, that we will be here for her . Pt very appreciative of call.

## 2021-02-24 ENCOUNTER — Encounter: Payer: Self-pay | Admitting: Internal Medicine

## 2021-02-24 ENCOUNTER — Ambulatory Visit (AMBULATORY_SURGERY_CENTER): Payer: Medicare Other | Admitting: Internal Medicine

## 2021-02-24 VITALS — BP 159/69 | HR 74 | Temp 96.9°F | Resp 15 | Ht 63.0 in | Wt 101.0 lb

## 2021-02-24 DIAGNOSIS — R1310 Dysphagia, unspecified: Secondary | ICD-10-CM | POA: Diagnosis not present

## 2021-02-24 DIAGNOSIS — K222 Esophageal obstruction: Secondary | ICD-10-CM | POA: Diagnosis not present

## 2021-02-24 DIAGNOSIS — C159 Malignant neoplasm of esophagus, unspecified: Secondary | ICD-10-CM | POA: Diagnosis not present

## 2021-02-24 MED ORDER — SODIUM CHLORIDE 0.9 % IV SOLN: 500.0000 mL | Freq: Once | INTRAVENOUS | Status: DC

## 2021-02-24 NOTE — Progress Notes (Signed)
Called to room to assist during endoscopic procedure.  Patient ID and intended procedure confirmed with present staff. Received instructions for my participation in the procedure from the performing physician.  

## 2021-02-24 NOTE — Op Note (Signed)
Cheryl Holmes: Cheryl Holmes Procedure Date: 02/24/2021 4:50 PM MRN: [id number] Endoscopist: Docia Chuck. Cheryl Holmes , MD Age: 88 Referring MD:  Date of Birth: [birth year] Gender: Female Account #: [id number] Procedure:                Upper GI endoscopy with balloon dilation of the                            esophagus. Max 12 mm Indications:              Therapeutic procedure, Dysphagia. Diagnosed with                            esophageal cancer July 2022 status post radiation                            therapy. Subsequently developed radiation stricture                            and has been dilated November 29, December 14, and                            December 23. Back for dilation Medicines:                Monitored Anesthesia Care Procedure:                Pre-Anesthesia Assessment:                           - Prior to the procedure, a History and Physical                            was performed, and patient medications and                            allergies were reviewed. The patient's tolerance of                            previous anesthesia was also reviewed. The risks                            and benefits of the procedure and the sedation                            options and risks were discussed with the patient.                            All questions were answered, and informed consent                            was obtained. Prior Anticoagulants: The patient has                            taken no previous anticoagulant or antiplatelet  agents. ASA Grade Assessment: III - A patient with                            severe systemic disease. After reviewing the risks                            and benefits, the patient was deemed in                            satisfactory condition to undergo the procedure.                           After obtaining informed consent, the endoscope was                            passed under  direct vision. Throughout the                            procedure, the patient's blood pressure, pulse, and                            oxygen saturations were monitored continuously. The                            GIF [id number] #[id number] was introduced through the                            mouth, and advanced to the second part of duodenum.                            The upper GI endoscopy was accomplished without                            difficulty. The patient tolerated the procedure                            well. Scope In: Scope Out: Findings:                 The cricopharyngeus was prominent but easily                            traversed. There was proximal esophageal web as                            previously noted. This was easily traversed. There                            was a small malignant appearing satellite nodule                            proximal to the radiation stricture. The mid                            esophageal  radiation stricture measured                            approximately 5 mm and would not permit the passage                            of the standard endoscope. A TTS dilator was passed                            through the scope. Dilation with an 12-20-11 mm                            balloon dilator was performed to 12 mm. The scope                            then passed beyond. There was a second satellite                            nodule beyond the stricture prior to the                            gastroesophageal junction. There was a benign                            peptic stricture at the gastroesophageal junction.                           The stomach was normal, save hiatal hernia.                           The examined duodenum was normal.                           The cardia and gastric fundus were normal on                            retroflexion. Complications:            No immediate complications. Estimated Blood Loss:     Estimated  blood loss: none. Impression:               1. Radiation stricture status post balloon dilation                            to 12 mm                           2. 2 satellite nodules in the esophagus likely                            malignant                           3. Esophageal web and distal peptic stricture as  previous                           4. Otherwise unremarkable EGD. Recommendation:           - Patient has a contact number available for                            emergencies. The signs and symptoms of potential                            delayed complications were discussed with the                            patient. Return to normal activities tomorrow.                            Written discharge instructions were provided to the                            patient.                           -Post dilation diet.                           - Continue present medications.                           -Repeat EGD with esophageal dilation in the [place].                            Please schedule for Tuesday, February 14 at [place] Cheryl Pastor, MD 02/24/2021 5:26:18 PM This report has been signed electronically.

## 2021-02-24 NOTE — Patient Instructions (Addendum)
Follow post dilation diet today.  See handout.  Resume previous medications.  Repeat EGD with dilation in the [place].     YOU HAD AN ENDOSCOPIC PROCEDURE TODAY AT [facility]:   Refer to the procedure report that was given to you for any specific questions about what was found during the examination.  If the procedure report does not answer your questions, please call your gastroenterologist to clarify.  If you requested that your care partner not be given the details of your procedure findings, then the procedure report has been included in a sealed envelope for you to review at your convenience later.  YOU SHOULD EXPECT: Some feelings of bloating in the abdomen. Passage of more gas than usual.  Walking can help get rid of the air that was put into your GI tract during the procedure and reduce the bloating. If you had a lower endoscopy (such as a colonoscopy or flexible sigmoidoscopy) you may notice spotting of blood in your stool or on the toilet paper. If you underwent a bowel prep for your procedure, you may not have a normal bowel movement for a few days.  Please Note:  You might notice some irritation and congestion in your nose or some drainage.  This is from the oxygen used during your procedure.  There is no need for concern and it should clear up in a day or so.  SYMPTOMS TO REPORT IMMEDIATELY:   Following upper endoscopy (EGD)  Vomiting of blood or coffee ground material  New chest pain or pain under the shoulder blades  Painful or persistently difficult swallowing  New shortness of breath  Fever of 100F or higher  Black, tarry-looking stools  For urgent or emergent issues, a gastroenterologist can be reached at any hour by calling [phone]. Do not use MyChart messaging for urgent concerns.    DIET:  We do recommend a small meal at first, but then you may proceed to your regular diet.  Drink plenty of fluids but you should avoid alcoholic beverages for 24  hours.  ACTIVITY:  You should plan to take it easy for the rest of today and you should NOT DRIVE or use heavy machinery until tomorrow (because of the sedation medicines used during the test).    FOLLOW UP: Our staff will call the number listed on your records 48-72 hours following your procedure to check on you and address any questions or concerns that you may have regarding the information given to you following your procedure. If we do not reach you, we will leave a message.  We will attempt to reach you two times.  During this call, we will ask if you have developed any symptoms of COVID 19. If you develop any symptoms (ie: fever, flu-like symptoms, shortness of breath, cough etc.) before then, please call [phone].  If you test positive for Covid 19 in the 2 weeks post procedure, please call and report this information to [place].    If any biopsies were taken you will be contacted by phone or by letter within the next 1-3 weeks.  Please call us at [phone] if you have not heard about the biopsies in 3 weeks.    SIGNATURES/CONFIDENTIALITY: You and/or your care partner have signed paperwork which will be entered into your electronic medical record.  These signatures attest to the fact that that the information above on your After Visit Summary has been reviewed and is understood.  Full responsibility of the confidentiality  of this discharge information lies with you and/or your care-partner.

## 2021-02-24 NOTE — Progress Notes (Signed)
Per Dr. [name], patient to have repeat EGD on 03/23/21 at 11:00 am.  Attempted to schedule but unable to d/t full schedule.  We don't have clearance to double book.

## 2021-02-24 NOTE — Progress Notes (Signed)
Sedate, gd SR, tolerated procedure well, VSS, report to RN 

## 2021-02-24 NOTE — Progress Notes (Signed)
VS by DT  Pt's states no medical or surgical changes since previsit or office visit.  

## 2021-02-25 ENCOUNTER — Telehealth: Payer: Self-pay

## 2021-02-25 DIAGNOSIS — C159 Malignant neoplasm of esophagus, unspecified: Secondary | ICD-10-CM

## 2021-02-25 DIAGNOSIS — K2289 Other specified disease of esophagus: Secondary | ICD-10-CM

## 2021-02-25 DIAGNOSIS — K222 Esophageal obstruction: Secondary | ICD-10-CM

## 2021-02-25 DIAGNOSIS — R1310 Dysphagia, unspecified: Secondary | ICD-10-CM

## 2021-02-25 DIAGNOSIS — K219 Gastro-esophageal reflux disease without esophagitis: Secondary | ICD-10-CM

## 2021-02-25 NOTE — Telephone Encounter (Signed)
Per EGD report, repeat EGD with dilation in the [place] scheduled for 03/23/21 at 11:00 am as requested. Instructions sent via MyChart. Called and spoke with pt to let her know about procedure instructions were sent to her mychart.   Ambulatory referral placed in epic.

## 2021-02-26 ENCOUNTER — Telehealth: Payer: Self-pay | Admitting: *Deleted

## 2021-02-26 NOTE — Telephone Encounter (Signed)
°  Follow up Call-  Call back number 02/24/2021 01/29/2021 01/20/2021 08/19/2020  Post procedure Call Back phone  # [phone]  Permission to leave phone message Yes Yes Yes Yes  Some recent data might be hidden     Patient questions:  Do you have a fever, pain , or abdominal swelling? No. Pain Score  0 *  Have you tolerated food without any problems? Yes.    Have you been able to return to your normal activities? Yes.    Do you have any questions about your discharge instructions: Diet   No. Medications  No. Follow up visit  No.  Do you have questions or concerns about your Care? No.  Actions: * If pain score is 4 or above: No action needed, pain <4.  Pt complained of more "soreness" in her esophagus with this procedure.  She states that she is able to tolerate liquids and soft foods.  Things have improved since yesterday.  Advised her to call back if symptoms worsen.    Have you developed a fever since your procedure? no  2.   Have you had an respiratory symptoms (SOB or cough) since your procedure? no  3.   Have you tested positive for COVID 19 since your procedure no  4.   Have you had any family members/close contacts diagnosed with the COVID 19 since your procedure?  no   If yes to any of these questions please route to Joylene John, RN and Joella Prince, RN

## 2021-03-23 ENCOUNTER — Other Ambulatory Visit: Payer: Self-pay

## 2021-03-23 ENCOUNTER — Encounter: Payer: Self-pay | Admitting: Internal Medicine

## 2021-03-23 ENCOUNTER — Ambulatory Visit (AMBULATORY_SURGERY_CENTER): Payer: Medicare Other | Admitting: Internal Medicine

## 2021-03-23 VITALS — BP 157/76 | HR 77 | Temp 98.0°F | Resp 10 | Ht 63.0 in | Wt 97.0 lb

## 2021-03-23 DIAGNOSIS — R1310 Dysphagia, unspecified: Secondary | ICD-10-CM

## 2021-03-23 DIAGNOSIS — K222 Esophageal obstruction: Secondary | ICD-10-CM | POA: Diagnosis not present

## 2021-03-23 DIAGNOSIS — C159 Malignant neoplasm of esophagus, unspecified: Secondary | ICD-10-CM

## 2021-03-23 DIAGNOSIS — D159 Benign neoplasm of intrathoracic organ, unspecified: Secondary | ICD-10-CM | POA: Diagnosis not present

## 2021-03-23 DIAGNOSIS — Z800 Family history of malignant neoplasm of digestive organs: Secondary | ICD-10-CM

## 2021-03-23 MED ORDER — SODIUM CHLORIDE 0.9 % IV SOLN: 500.0000 mL | Freq: Once | INTRAVENOUS | Status: DC

## 2021-03-23 NOTE — Progress Notes (Signed)
VS- Cheryl Holmes  Pt states "my involuntary cough is worse." Pt states "I haven't been as comfortable since the last treatment as I was before."

## 2021-03-23 NOTE — Progress Notes (Signed)
Sedate, gd SR, tolerated procedure well, VSS, report to RN 

## 2021-03-23 NOTE — Op Note (Signed)
[facility] Patient Name: Cheryl Holmes Procedure Date: 03/23/2021 10:56 AM MRN: [id number] Endoscopist: Docia Chuck. [name] , MD Age: 88 Referring MD:  Date of Birth: Dec 04, 1926 Gender: Female Account #: [id number] Procedure:                Upper GI endoscopy with balloon dilation of the                            esophagus. 11 mm max Indications:              Therapeutic procedure, Dysphagia. 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 Status post noncurative radiation                            therapy. Has undergone multiple (4) dilations of                            the esophagus for radiation stricture since late                            November 2022. Last EGD with dilation February 24, 2021. Medicines:                Monitored Anesthesia Care Procedure:                Pre-Anesthesia Assessment:                           - Prior to the procedure, a History and Physical                            was performed, and patient medications and                            allergies were reviewed. The patient's tolerance of                            previous anesthesia was also reviewed. The risks                            and benefits of the procedure and the sedation                            options and risks were discussed with the patient.                            All questions were answered, and informed consent  was obtained. Prior Anticoagulants: The patient has                            taken no previous anticoagulant or antiplatelet                            agents. ASA Grade Assessment: III - A patient with                            severe systemic disease. After reviewing the risks                            and benefits, the patient was deemed in                            satisfactory condition to undergo the procedure.                            After obtaining informed consent, the endoscope was                            passed under direct vision. Throughout the                            procedure, the patient's blood pressure, pulse, and                            oxygen saturations were monitored continuously. The                            Endoscope was introduced through the mouth, and                            advanced to the second part of duodenum. The upper                            GI endoscopy was accomplished without difficulty.                            The patient tolerated the procedure well. Scope In: Scope Out: Findings:                 The esophagus revealed a cervical web as previous.                            There was a nodule at 25 cm consistent with                            carcinoma. There was a radiation stricture at 30                            cm. This would not allow passage of the endoscope                            (  last dilation performed as outlined below). Beyond                            this stricture was a second nodule at 35 cm. There                            was a benign peptic stricture at 38 cm.                           One severe stenosis was found in the mid esophagus.                            This was believed to be radiation related. A TTS                            dilator was passed through the scope. Dilation with                            an 12-20-11 mm balloon dilator was performed to 11                            mm. There was a significant mucosal wrent post                            dilation. The endoscope then passed beyond. See                            images                           The stomach was normal. Moderate hiatal hernia.                           The examined duodenum was normal.                           The cardia and gastric fundus were normal on                            retroflexion. Complications:            No  immediate complications. Estimated Blood Loss:     Estimated blood loss: none. Impression:               1. Radiation stricture status post dilation                           2. 2 satellite esophageal nodules consistent with                            recurrent/persistent cancer.                           3. 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 Otherwise unremarkable EGD Recommendation:           - Patient has a contact number available for                            emergencies. The signs and symptoms of potential                            delayed complications were discussed with the                            patient. Return to normal activities tomorrow.                            Written discharge instructions were provided to the                            patient.                           - Post dilation diet. Then, modified diet as                            previous                           - Continue present medications.                           - Repeat EGD with dilation in 4 weeks, if patient                            is interested [employer]. [name], MD 03/23/2021 11:25:57 AM This report has been signed electronically.

## 2021-03-23 NOTE — Patient Instructions (Signed)
YOU HAD AN ENDOSCOPIC PROCEDURE TODAY AT THE [facility]:   Refer to the procedure report that was given to you for any specific questions about what was found during the examination.  If the procedure report does not answer your questions, please call your gastroenterologist to clarify.  If you requested that your care partner not be given the details of your procedure findings, then the procedure report has been included in a sealed envelope for you to review at your convenience later. ° °**Handout given on post dilation diet °YOU SHOULD EXPECT: Some feelings of bloating in the abdomen. Passage of more gas than usual.  Walking can help get rid of the air that was put into your GI tract during the procedure and reduce the bloating. If you had a lower endoscopy (such as a colonoscopy or flexible sigmoidoscopy) you may notice spotting of blood in your stool or on the toilet paper. If you underwent a bowel prep for your procedure, you may not have a normal bowel movement for a few days. ° °Please Note:  You might notice some irritation and congestion in your nose or some drainage.  This is from the oxygen used during your procedure.  There is no need for concern and it should clear up in a day or so. ° °SYMPTOMS TO REPORT IMMEDIATELY: ° °Following upper endoscopy (EGD) ° Vomiting of blood or coffee ground material ° New chest pain or pain under the shoulder blades ° Painful or persistently difficult swallowing ° New shortness of breath ° Fever of 100°F or higher ° Black, tarry-looking stools ° °For urgent or emergent issues, a gastroenterologist can be reached at any hour by calling [phone]. °Do not use MyChart messaging for urgent concerns.  ° ° °DIET:  We do recommend a small meal at first, but then you may proceed to your regular diet.  Drink plenty of fluids but you should avoid alcoholic beverages for 24 hours. ° °ACTIVITY:  You should plan to take it easy for the rest of today and you should  NOT DRIVE or use heavy machinery until tomorrow (because of the sedation medicines used during the test).   ° °FOLLOW UP: °Our staff will call the number listed on your records 48-72 hours following your procedure to check on you and address any questions or concerns that you may have regarding the information given to you following your procedure. If we do not reach you, we will leave a message.  We will attempt to reach you two times.  During this call, we will ask if you have developed any symptoms of COVID 19. If you develop any symptoms (ie: fever, flu-like symptoms, shortness of breath, cough etc.) before then, please call [phone].  If you test positive for Covid 19 in the 2 weeks post procedure, please call and report this information to us.   ° °If any biopsies were taken you will be contacted by phone or by letter within the next 1-3 weeks.  Please call us at [phone] if you have not heard about the biopsies in 3 weeks.  ° ° °SIGNATURES/CONFIDENTIALITY: °You and/or your care partner have signed paperwork which will be entered into your electronic medical record.  These signatures attest to the fact that that the information above on your After Visit Summary has been reviewed and is understood.  Full responsibility of the confidentiality of this discharge information lies with you and/or your care-partner.  °

## 2021-03-23 NOTE — Progress Notes (Signed)
Called to room to assist during endoscopic procedure.  Patient ID and intended procedure confirmed with present staff. Received instructions for my participation in the procedure from the performing physician.  

## 2021-03-23 NOTE — Progress Notes (Signed)
Patients daughter wants to look at her calendar before rescheduling her next EGD/ dilation. Will write on procedure report to call patient's daughter to schedule.

## 2021-03-23 NOTE — Progress Notes (Signed)
HISTORY OF PRESENT ILLNESS:  Cheryl Holmes is a 88 y.o. 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  Status postradiation therapy.  Problems with severe dysphagia posttreatment secondary to radiation stricture.  Has undergone multiple repeat dilations (4) since late November 2022 for palliation.  She does have satellite lesions in the esophagus.  Her last dilation February 24, 2021.  REVIEW OF SYSTEMS:  All non-GI ROS negative.  Past Medical History:  Diagnosis Date   Actinic keratosis    Aftercare following surgery of the skin or subcutaneous tissue    Basal cell carcinoma of skin of other parts of face    Bone/cartilage disorder    nose   Esophagus cancer ([place])    Hypertension    Neoplasm of uncertain behavior of skin    Screening for malignant neoplasm of the rectum    Thyroid disease     Past Surgical History:  Procedure Laterality Date   APPENDECTOMY  1939   Dr. Griffin [facility]   BACK SURGERY  2005   Dr. Micheal Likens   BALLOON DILATION N/A 01/05/2021   Procedure: Larrie Kass DILATION;  Surgeon: Irene Shipper, MD;  Location: [facility];  Service: Endoscopy;  Laterality: N/A;   Cancer Removal Forehead, Basal Cell N/A 11/24/2009   colonoscopy performation  2000   Dr. Maurene Capes, Dr.Weatherly   ESOPHAGOGASTRODUODENOSCOPY (EGD) WITH PROPOFOL N/A 06/06/2018   Procedure: ESOPHAGOGASTRODUODENOSCOPY (EGD) WITH PROPOFOL;  Surgeon: Milus Banister, MD;  Location: [facility];  Service: Endoscopy;  Laterality: N/A;   ESOPHAGOGASTRODUODENOSCOPY (EGD) WITH PROPOFOL N/A 01/05/2021   Procedure: ESOPHAGOGASTRODUODENOSCOPY (EGD) WITH PROPOFOL;  Surgeon: Irene Shipper, MD;  Location: [facility];  Service: Endoscopy;  Laterality: N/A;   FOREIGN BODY REMOVAL  06/06/2018   Procedure: FOREIGN BODY REMOVAL;  Surgeon: Milus Banister, MD;  Location: [facility];  Service: Endoscopy;;   SAVORY DILATION N/A 01/05/2021   Procedure: Azzie Almas DILATION;  Surgeon: Irene Shipper, MD;  Location: [facility];  Service: Endoscopy;  Laterality: N/A;   UPPER GASTROINTESTINAL ENDOSCOPY      Social History Lucille C Accomando  reports that she has quit smoking. Her smoking use included cigarettes. She has never used smokeless tobacco. She reports current alcohol use. She reports that she does not use drugs.  family history includes Heart disease in her father; Kidney disease in her mother.  Allergies  Allergen Reactions   Polysporin [Bacitracin-Polymyxin B] Anaphylaxis and Rash   Sulfonamide Derivatives Anaphylaxis and Rash    REACTION: hive       PHYSICAL EXAMINATION:  Vital signs: BP (!) 151/82    Pulse 85    Temp 98 F (36.7 C) (Skin)    Ht 5\' 3"  (1.6 m)    Wt 97 lb (44 kg)    SpO2 100%    BMI 17.18 kg/m  General: Well-developed, well-nourished, no acute distress HEENT: Sclerae are anicteric, conjunctiva pink. Oral mucosa intact Lungs: Clear Heart: Regular Abdomen: soft, nontender, nondistended, no obvious ascites, no peritoneal signs, normal bowel sounds. No organomegaly. Extremities: No edema Psychiatric: alert and oriented x3. Cooperative     ASSESSMENT:  1.  Esophageal cancer status post radiation 2.  Radiation stricture 3.  GERD with peptic stricture 4.  Known prominent cricopharyngeus and proximal esophageal web 5.  Cough  PLAN:  1.  Upper endoscopy with probable esophageal dilation.  The patient is HIGH RISK given her age, disease, and abnormal esophagus

## 2021-03-25 ENCOUNTER — Telehealth: Payer: Self-pay

## 2021-03-25 ENCOUNTER — Telehealth: Payer: Self-pay | Admitting: Internal Medicine

## 2021-03-25 ENCOUNTER — Other Ambulatory Visit: Payer: Self-pay

## 2021-03-25 DIAGNOSIS — K222 Esophageal obstruction: Secondary | ICD-10-CM

## 2021-03-25 DIAGNOSIS — R1310 Dysphagia, unspecified: Secondary | ICD-10-CM

## 2021-03-25 NOTE — Telephone Encounter (Signed)
Patient called this morning to schedule her 4-week EGD with dilation.  She is scheduled for 04/19/21 at 11:00 a.m.  Thank you.

## 2021-03-25 NOTE — Telephone Encounter (Signed)
Ambulatory referral entered in epic.

## 2021-03-25 NOTE — Telephone Encounter (Signed)
°  Follow up Call-  Call back number 03/23/2021 02/24/2021 01/29/2021 01/20/2021 08/19/2020  Post procedure Call Back phone  # [phone]  Permission to leave phone message Yes Yes Yes Yes Yes  Some recent data might be hidden     Patient questions:  Do you have a fever, pain , or abdominal swelling? No. Pain Score  0 *  Have you tolerated food without any problems? Yes.    Have you been able to return to your normal activities? Yes.    Do you have any questions about your discharge instructions: Diet   No. Medications  No. Follow up visit  No.  Do you have questions or concerns about your Care? No.  Actions: * If pain score is 4 or above: No action needed, pain <4.

## 2021-03-25 NOTE — Telephone Encounter (Signed)
Prep instructions sent to pt via mychart

## 2021-04-19 ENCOUNTER — Ambulatory Visit (AMBULATORY_SURGERY_CENTER): Payer: Medicare Other | Admitting: Internal Medicine

## 2021-04-19 ENCOUNTER — Encounter: Payer: Self-pay | Admitting: Internal Medicine

## 2021-04-19 VITALS — BP 104/59 | HR 72 | Temp 97.1°F | Resp 17 | Ht 63.0 in | Wt 96.0 lb

## 2021-04-19 DIAGNOSIS — E039 Hypothyroidism, unspecified: Secondary | ICD-10-CM | POA: Diagnosis not present

## 2021-04-19 DIAGNOSIS — C159 Malignant neoplasm of esophagus, unspecified: Secondary | ICD-10-CM | POA: Diagnosis not present

## 2021-04-19 DIAGNOSIS — K222 Esophageal obstruction: Secondary | ICD-10-CM

## 2021-04-19 DIAGNOSIS — R1310 Dysphagia, unspecified: Secondary | ICD-10-CM

## 2021-04-19 MED ORDER — SODIUM CHLORIDE 0.9 % IV SOLN: 500.0000 mL | INTRAVENOUS | Status: DC

## 2021-04-19 NOTE — Progress Notes (Signed)
Called to room to assist during endoscopic procedure.  Patient ID and intended procedure confirmed with present staff. Received instructions for my participation in the procedure from the performing physician.  

## 2021-04-19 NOTE — Patient Instructions (Signed)
Dilation diet for today - see handout. ? ?Repeat EGD in 4 weeks for dilation. ? ? ?YOU HAD AN ENDOSCOPIC PROCEDURE TODAY AT [facility]:   Refer to the procedure report that was given to you for any specific questions about what was found during the examination.  If the procedure report does not answer your questions, please call your gastroenterologist to clarify.  If you requested that your care partner not be given the details of your procedure findings, then the procedure report has been included in a sealed envelope for you to review at your convenience later. ? ?YOU SHOULD EXPECT: Some feelings of bloating in the abdomen. Passage of more gas than usual.  Walking can help get rid of the air that was put into your GI tract during the procedure and reduce the bloating. If you had a lower endoscopy (such as a colonoscopy or flexible sigmoidoscopy) you may notice spotting of blood in your stool or on the toilet paper. If you underwent a bowel prep for your procedure, you may not have a normal bowel movement for a few days. ? ?Please Note:  You might notice some irritation and congestion in your nose or some drainage.  This is from the oxygen used during your procedure.  There is no need for concern and it should clear up in a day or so. ? ?SYMPTOMS TO REPORT IMMEDIATELY: ? ? ?Following upper endoscopy (EGD) ? Vomiting of blood or coffee ground material ? New chest pain or pain under the shoulder blades ? Painful or persistently difficult swallowing ? New shortness of breath ? Fever of 100?F or higher ? Black, tarry-looking stools ? ?For urgent or emergent issues, a gastroenterologist can be reached at any hour by calling [phone]. ?Do not use MyChart messaging for urgent concerns.  ? ? ?DIET:  We do recommend a small meal at first, but then you may proceed to your regular diet.  Drink plenty of fluids but you should avoid alcoholic beverages for 24 hours. ? ?ACTIVITY:  You should plan to take  it easy for the rest of today and you should NOT DRIVE or use heavy machinery until tomorrow (because of the sedation medicines used during the test).   ? ?FOLLOW UP: ?Our staff will call the number listed on your records 48-72 hours following your procedure to check on you and address any questions or concerns that you may have regarding the information given to you following your procedure. If we do not reach you, we will leave a message.  We will attempt to reach you two times.  During this call, we will ask if you have developed any symptoms of COVID 19. If you develop any symptoms (ie: fever, flu-like symptoms, shortness of breath, cough etc.) before then, please call [phone].  If you test positive for Covid 19 in the 2 weeks post procedure, please call and report this information to [place].   ? ?If any biopsies were taken you will be contacted by phone or by letter within the next 1-3 weeks.  Please call us at [phone] if you have not heard about the biopsies in 3 weeks.  ? ? ?SIGNATURES/CONFIDENTIALITY: ?You and/or your care partner have signed paperwork which will be entered into your electronic medical record.  These signatures attest to the fact that that the information above on your After Visit Summary has been reviewed and is understood.  Full responsibility of the confidentiality of this discharge information lies with you  and/or your care-partner.  ?

## 2021-04-19 NOTE — Progress Notes (Signed)
Report to PACU, RN, vss, BBS= Clear.  

## 2021-04-19 NOTE — Progress Notes (Signed)
HISTORY OF PRESENT ILLNESS: ? ?Cheryl Holmes is a 88 y.o. female with esophageal cancer diagnosed July 2022 status post radiation therapy."  Radiation stricture of the residual cancer.  She is undergone esophageal dilation on 5 occasions between January 05, 2021 and March 23, 2021.  She returns today for the same.  She has had some relief of her severe dysphagia after each dilation session.  She understands that she is high risk for dilation due to her anatomy, cancer, and age.  She previously declines gastrostomy feeding tube ? ?REVIEW OF SYSTEMS: ? ?All non-GI ROS negative. ? ?Past Medical History:  ?Diagnosis Date  ? Actinic keratosis   ? Aftercare following surgery of the skin or subcutaneous tissue   ? Basal cell carcinoma of skin of other parts of face   ? Bone/cartilage disorder   ? nose  ? Esophagus cancer ([place])   ? Hypertension   ? Neoplasm of uncertain behavior of skin   ? Screening for malignant neoplasm of the rectum   ? Thyroid disease   ? ? ?Past Surgical History:  ?Procedure Laterality Date  ? APPENDECTOMY  1939  ? Dr. Charisse March  ? BACK SURGERY  2005  ? Dr. Micheal Likens  ? BALLOON DILATION N/A 01/05/2021  ? Procedure: BALLOON DILATION;  Surgeon: Irene Shipper, MD;  Location: [facility];  Service: Endoscopy;  Laterality: N/A;  ? Cancer Removal Forehead, Basal Cell N/A 11/24/2009  ? colonoscopy performation  2000  ? Dr. Maurene Capes, Dr.Weatherly  ? ESOPHAGOGASTRODUODENOSCOPY (EGD) WITH PROPOFOL N/A 06/06/2018  ? Procedure: ESOPHAGOGASTRODUODENOSCOPY (EGD) WITH PROPOFOL;  Surgeon: Milus Banister, MD;  Location: [facility];  Service: Endoscopy;  Laterality: N/A;  ? ESOPHAGOGASTRODUODENOSCOPY (EGD) WITH PROPOFOL N/A 01/05/2021  ? Procedure: ESOPHAGOGASTRODUODENOSCOPY (EGD) WITH PROPOFOL;  Surgeon: Irene Shipper, MD;  Location: [facility];  Service: Endoscopy;  Laterality: N/A;  ? FOREIGN BODY REMOVAL  06/06/2018  ? Procedure: FOREIGN BODY REMOVAL;  Surgeon: Milus Banister, MD;  Location:  [facility];  Service: Endoscopy;;  ? SAVORY DILATION N/A 01/05/2021  ? Procedure: SAVORY DILATION;  Surgeon: Irene Shipper, MD;  Location: [facility];  Service: Endoscopy;  Laterality: N/A;  ? UPPER GASTROINTESTINAL ENDOSCOPY    ? ? ?Social History ?Cheryl Holmes  reports that she has quit smoking. Her smoking use included cigarettes. She has never used smokeless tobacco. She reports current alcohol use. She reports that she does not use drugs. ? ?family history includes Heart disease in her father; Kidney disease in her mother. ? ?Allergies  ?Allergen Reactions  ? Polysporin [Bacitracin-Polymyxin B] Anaphylaxis and Rash  ? Sulfonamide Derivatives Anaphylaxis and Rash  ?  REACTION: hive  ? ? ?  ? ?PHYSICAL EXAMINATION: ? ?Vital signs: BP 139/61   Pulse 86   Temp (!) 97.1 ?F (36.2 ?C)   Ht '5\' 3"'$  (1.6 m)   Wt 96 lb (43.5 kg)   SpO2 99%   BMI 17.01 kg/m?  ?General: Well-developed, well-nourished, no acute distress ?HEENT: Sclerae are anicteric, conjunctiva pink. Oral mucosa intact ?Lungs: Clear ?Heart: Regular ?Abdomen: soft, nontender, nondistended, no obvious ascites, no peritoneal signs, normal bowel sounds. No organomegaly. ?Extremities: No edema ?Psychiatric: alert and oriented x3. Cooperative  ? ? ? ?ASSESSMENT: ? ?Esophageal cancer and radiation stricture.  Ongoing dysphagia ? ? ?PLAN: ? ? ?EGD with dilation ? ? ? ?  ?

## 2021-04-19 NOTE — Op Note (Signed)
[place] ?Patient Name: Cheryl Holmes ?Procedure Date: 04/19/2021 10:26 AM ?MRN: [id number] ?Endoscopist: Docia Chuck. [name] , MD ?Age: 88 ?Referring MD:  ?Date of Birth: 23-Jul-1926 ?Gender: Female ?Account #: [id number] ?Procedure:                Upper GI endoscopy with balloon dilation of the  ?                          esophagus. 12 mm max ?Indications:              Therapeutic procedure, Dysphagia. History of  ?                          esophageal cancer status post radiation therapy.  ?                          Has known radiation stricture as well as residual  ?                          esophageal disease. Has been undergoing repeat  ?                          endoscopies with esophageal dilation for palliative  ?                          purposes. She has undergone 5 previous dilations  ?                          between January 05, 2021 and March 23, 2021.  ?                          She returns for the same ?Medicines:                Monitored Anesthesia Care ?Procedure:                Pre-Anesthesia Assessment: ?                          - Prior to the procedure, a History and Physical  ?                          was performed, and patient medications and  ?                          allergies were reviewed. The patient's tolerance of  ?                          previous anesthesia was also reviewed. The risks  ?                          and benefits of the procedure and the sedation  ?                          options and risks were discussed with the patient.  ?  All questions were answered, and informed consent  ?                          was obtained. Prior Anticoagulants: The patient has  ?                          taken no previous anticoagulant or antiplatelet  ?                          agents. ASA Grade Assessment: III - A patient with  ?                          severe systemic disease. After reviewing the risks  ?                          and benefits, the  patient was deemed in  ?                          satisfactory condition to undergo the procedure. ?                          After obtaining informed consent, the endoscope was  ?                          passed under direct vision. Throughout the  ?                          procedure, the patient's blood pressure, pulse, and  ?                          oxygen saturations were monitored continuously. The  ?                          GIF [id number] #[id number] was introduced through the  ?                          mouth, and advanced to the second part of duodenum.  ?                          The upper GI endoscopy was accomplished without  ?                          difficulty. The patient tolerated the procedure  ?                          well. ?Scope In: ?Scope Out: ?Findings:                 The esophagus revealed a proximal esophageal web  ?                          and somewhat larger malignant appearing nodule as  ?                          previously  described. There was a radiation  ?                          stricture at 30 cm from the incisors. This would  ?                          not permit passage of the standard endoscope. This  ?                          was balloon dilated with TTS esophageal balloon at  ?                          11 mm followed by 12 mm. The endoscope then passed  ?                          beyond. There was expected trauma. Below was also  ?                          an enlarging. Nodule as previously described.  ?                          Benign large caliber peptic stricture.. ?                          The stomach revealed a sliding hiatal hernia but  ?                          was otherwise. ?                          The examined duodenum was normal. ?                          The cardia and gastric fundus were normal on  ?                          retroflexion. ?Complications:            No immediate complications. ?Estimated Blood Loss:     Estimated blood loss: none. ?Impression:                1. Radiation stricture status post dilation ?                          2. Enlarging esophageal nodules consistent with  ?                          progressive cancer ?                          3. Other findings on EGD as noted. ?Recommendation:           - Patient has a contact number available for  ?                          emergencies. The signs and symptoms of potential  ?  delayed complications were discussed with the  ?                          patient. Return to normal activities tomorrow.  ?                          Written discharge instructions were provided to the  ?                          patient. ?                          - Resume previous diet. ?                          - Continue present medications. ?                          - Repeat EGD with dilation in 4 weeks ?Docia Chuck. [name], MD ?04/19/2021 11:19:17 AM ?This report has been signed electronically. ?

## 2021-04-21 ENCOUNTER — Telehealth: Payer: Self-pay | Admitting: *Deleted

## 2021-04-21 DIAGNOSIS — R1310 Dysphagia, unspecified: Secondary | ICD-10-CM

## 2021-04-21 NOTE — Telephone Encounter (Signed)
EGD instructions sent to patient [facility]. ?

## 2021-04-21 NOTE — Telephone Encounter (Signed)
?  Follow up Call- ? ?Call back number 04/19/2021 03/23/2021 02/24/2021 01/29/2021 01/20/2021 08/19/2020  ?Post procedure Call Back phone  # [phone]  ?Permission to leave phone message Yes Yes Yes Yes Yes Yes  ?Some recent data might be hidden  ?  ? ?Patient questions: ? ?Do you have a fever, pain , or abdominal swelling? No. ?Pain Score  0 * ? ?Have you tolerated food without any problems? Yes.   ? ?Have you been able to return to your normal activities? Yes.   ? ?Do you have any questions about your discharge instructions: ?Diet   No. ?Medications  No. ?Follow up visit  No. ? ?Do you have questions or concerns about your Care? No. ? ?Actions: ?* If pain score is 4 or above: ?No action needed, pain <4. ? ? ?

## 2021-04-21 NOTE — Telephone Encounter (Signed)
Called the patient this afternoon to get ger scheduled for her repeat EGD with dilation with Dr. [name] in 4 weeks.   ?Will send instructions via My Chart. ?

## 2021-05-18 ENCOUNTER — Encounter: Payer: Self-pay | Admitting: Internal Medicine

## 2021-05-18 ENCOUNTER — Encounter: Payer: Self-pay | Admitting: Hematology and Oncology

## 2021-05-27 ENCOUNTER — Ambulatory Visit (AMBULATORY_SURGERY_CENTER): Payer: Medicare Other | Admitting: Internal Medicine

## 2021-05-27 ENCOUNTER — Encounter: Payer: Self-pay | Admitting: Hematology and Oncology

## 2021-05-27 ENCOUNTER — Telehealth: Payer: Self-pay

## 2021-05-27 ENCOUNTER — Encounter: Payer: Self-pay | Admitting: Internal Medicine

## 2021-05-27 VITALS — BP 97/75 | HR 78 | Temp 96.6°F | Resp 18 | Ht 63.0 in | Wt 101.0 lb

## 2021-05-27 DIAGNOSIS — K222 Esophageal obstruction: Secondary | ICD-10-CM

## 2021-05-27 DIAGNOSIS — C159 Malignant neoplasm of esophagus, unspecified: Secondary | ICD-10-CM

## 2021-05-27 DIAGNOSIS — R1310 Dysphagia, unspecified: Secondary | ICD-10-CM | POA: Diagnosis not present

## 2021-05-27 MED ORDER — SODIUM CHLORIDE 0.9 % IV SOLN: 500.0000 mL | Freq: Once | INTRAVENOUS | Status: DC

## 2021-05-27 NOTE — Op Note (Signed)
[place] ?Patient Name: Cheryl Holmes ?Procedure Date: 05/27/2021 10:10 AM ?MRN: [id number] ?Endoscopist: Docia Chuck. [name] , MD ?Age: 88 ?Referring MD:  ?Date of Birth: 1926-06-29 ?Gender: Female ?Account #: [id number] ?Procedure:                Upper GI endoscopy with esophageal balloon  ?                          dilation. 10, 11, 12 mm ?Indications:              Therapeutic procedure, Dysphagia. Personal history  ?                          of esophageal cancer status post radiation therapy.  ?                          Subsequent radiation-induced stricture. Known to  ?                          have residual disease above and below the  ?                          strictured area. Has had multiple palliative  ?                          dilations. Most recent dilation April 19, 2021. She  ?                          represents for the same ?Medicines:                Monitored Anesthesia Care ?Procedure:                Pre-Anesthesia Assessment: ?                          - Prior to the procedure, a History and Physical  ?                          was performed, and patient medications and  ?                          allergies were reviewed. The patient's tolerance of  ?                          previous anesthesia was also reviewed. The risks  ?                          and benefits of the procedure and the sedation  ?                          options and risks were discussed with the patient.  ?                          All questions were answered, and informed consent  ?  was obtained. Prior Anticoagulants: The patient has  ?                          taken no previous anticoagulant or antiplatelet  ?                          agents. ASA Grade Assessment: III - A patient with  ?                          severe systemic disease. After reviewing the risks  ?                          and benefits, the patient was deemed in  ?                          satisfactory condition to  undergo the procedure. ?                          After obtaining informed consent, the endoscope was  ?                          passed under direct vision. Throughout the  ?                          procedure, the patient's blood pressure, pulse, and  ?                          oxygen saturations were monitored continuously. The  ?                          Endoscope was introduced through the mouth, and  ?                          advanced to the second part of duodenum. The upper  ?                          GI endoscopy was accomplished without difficulty.  ?                          The patient tolerated the procedure well. ?Scope In: ?Scope Out: ?Findings:                 The esophagus revealed proximal esophageal web and  ?                          distal esophageal stricture as previously described  ?                          on prior reports. As well, malignant appearing  ?                          nodularity above and below the mid esophageal  ?  strictured area as previously described. The mid  ?                          esophageal stricture measured approximately 4 mm.  ?                          It was slightly friable. Standard endoscope would  ?                          not pass beyond.. A TTS dilator was passed through  ?                          the scope. Dilation with a 11-18-10 mm balloon  ?                          dilator was performed to 12 mm. There was moderate  ?                          heme and slight mucosal disruption. The endoscope  ?                          then passed beyond this region without difficulty ?                          The stomach was normal save hiatal hernia. ?                          The examined duodenum was normal. ?                          The cardia and gastric fundus were normal on  ?                          retroflexion. ?Complications:            No immediate complications. ?Estimated Blood Loss:     Estimated blood loss:  none. ?Impression:               1. Radiation stricture of the mid esophagus status  ?                          post balloon dilation ?                          2. Residual esophageal cancer ?                          3. Proximal esophageal web and distal esophageal  ?                          stricture ?                          4. Otherwise unremarkable EGD. ?Recommendation:           1. Patient has a contact number available for  ?  emergencies. The signs and symptoms of potential  ?                          delayed complications were discussed with the  ?                          patient. Return to normal activities tomorrow.  ?                          Written discharge instructions were provided to the  ?                          patient. ?                          2. Resume previous (modified) diet. ?                          3. Continue present medications. ?                          4. Schedule upper endoscopy with esophageal  ?                          dilation in the [facility] in about 4 weeks ?Docia Chuck. [name], MD ?05/27/2021 11:29:39 AM ?This report has been signed electronically. ?

## 2021-05-27 NOTE — Progress Notes (Signed)
Called to room to assist during endoscopic procedure.  Patient ID and intended procedure confirmed with present staff. Received instructions for my participation in the procedure from the performing physician.  

## 2021-05-27 NOTE — Telephone Encounter (Signed)
Left Vm for Margaretmary Eddy RN w/ [facility] to refer pt to [facility]. ?

## 2021-05-27 NOTE — Progress Notes (Signed)
Pt in recovery with monitors in place, VSS. Report given to receiving RN. Bite guard was placed with pt awake to ensure comfort. No dental or soft tissue damage noted. MD removed bite guard prior to transfer. ?

## 2021-05-27 NOTE — Progress Notes (Signed)
HISTORY OF PRESENT ILLNESS: ? ?Cheryl Holmes is a 88 y.o. female with a history of esophageal cancer status post radiation therapy complicated by radiation stricture for which she is undergone multiple prior esophageal dilations.  Most recently was 13 2023.  See that procedure note.  She returns for the same. ? ?REVIEW OF SYSTEMS: ? ?All non-GI ROS negative. ?Past Medical History:  ?Diagnosis Date  ? Actinic keratosis   ? Aftercare following surgery of the skin or subcutaneous tissue   ? Basal cell carcinoma of skin of other parts of face   ? Bone/cartilage disorder   ? nose  ? Esophagus cancer ([place])   ? Hypertension   ? Neoplasm of uncertain behavior of skin   ? Screening for malignant neoplasm of the rectum   ? Thyroid disease   ? ? ?Past Surgical History:  ?Procedure Laterality Date  ? APPENDECTOMY  1939  ? Dr. Charisse March  ? BACK SURGERY  2005  ? Dr. Micheal Likens  ? BALLOON DILATION N/A 01/05/2021  ? Procedure: BALLOON DILATION;  Surgeon: Irene Shipper, MD;  Location: [facility];  Service: Endoscopy;  Laterality: N/A;  ? Cancer Removal Forehead, Basal Cell N/A 11/24/2009  ? colonoscopy performation  2000  ? Dr. Maurene Capes, Dr.Weatherly  ? ESOPHAGOGASTRODUODENOSCOPY (EGD) WITH PROPOFOL N/A 06/06/2018  ? Procedure: ESOPHAGOGASTRODUODENOSCOPY (EGD) WITH PROPOFOL;  Surgeon: Milus Banister, MD;  Location: [facility];  Service: Endoscopy;  Laterality: N/A;  ? ESOPHAGOGASTRODUODENOSCOPY (EGD) WITH PROPOFOL N/A 01/05/2021  ? Procedure: ESOPHAGOGASTRODUODENOSCOPY (EGD) WITH PROPOFOL;  Surgeon: Irene Shipper, MD;  Location: [facility];  Service: Endoscopy;  Laterality: N/A;  ? FOREIGN BODY REMOVAL  06/06/2018  ? Procedure: FOREIGN BODY REMOVAL;  Surgeon: Milus Banister, MD;  Location: [facility];  Service: Endoscopy;;  ? SAVORY DILATION N/A 01/05/2021  ? Procedure: SAVORY DILATION;  Surgeon: Irene Shipper, MD;  Location: [facility];  Service: Endoscopy;  Laterality: N/A;  ? UPPER GASTROINTESTINAL  ENDOSCOPY    ? ? ?Social History ?Cheryl Holmes  reports that she has quit smoking. Her smoking use included cigarettes. She has never used smokeless tobacco. She reports that she does not currently use alcohol. She reports that she does not use drugs. ? ?family history includes Heart disease in her father; Kidney disease in her mother. ? ?Allergies  ?Allergen Reactions  ? Polysporin [Bacitracin-Polymyxin B] Anaphylaxis and Rash  ? Sulfonamide Derivatives Anaphylaxis and Rash  ?  REACTION: hive  ? ? ?  ? ?PHYSICAL EXAMINATION: ? ?Vital signs: BP (!) 145/62   Pulse 91   Temp (!) 96.6 ?F (35.9 ?C) (Temporal)   Resp (!) 25   Ht '5\' 3"'$  (1.6 m)   Wt 101 lb (45.8 kg)   SpO2 100%   BMI 17.89 kg/m?  ?General: Well-developed, well-nourished, no acute distress ?HEENT: Sclerae are anicteric, conjunctiva pink. Oral mucosa intact ?Lungs: Clear ?Heart: Regular ?Abdomen: soft, nontender, nondistended, no obvious ascites, no peritoneal signs, normal bowel sounds. No organomegaly. ?Extremities: No edema ?Psychiatric: alert and oriented x3. Cooperative  ? ? ? ?ASSESSMENT: ? ?Esophageal cancer status post radiation therapy with radiation stricture.  Recurrent dysphagia ? ? ?PLAN: ? ? ?Upper endoscopy with esophageal dilation ? ? ? ?  ?

## 2021-05-27 NOTE — Patient Instructions (Signed)
YOU HAD AN ENDOSCOPIC PROCEDURE TODAY AT THE [facility]:   Refer to the procedure report that was given to you for any specific questions about what was found during the examination.  If the procedure report does not answer your questions, please call your gastroenterologist to clarify.  If you requested that your care partner not be given the details of your procedure findings, then the procedure report has been included in a sealed envelope for you to review at your convenience later.  YOU SHOULD EXPECT: Some feelings of bloating in the abdomen. Passage of more gas than usual.  Walking can help get rid of the air that was put into your GI tract during the procedure and reduce the bloating. If you had a lower endoscopy (such as a colonoscopy or flexible sigmoidoscopy) you may notice spotting of blood in your stool or on the toilet paper. If you underwent a bowel prep for your procedure, you may not have a normal bowel movement for a few days.  Please Note:  You might notice some irritation and congestion in your nose or some drainage.  This is from the oxygen used during your procedure.  There is no need for concern and it should clear up in a day or so.  SYMPTOMS TO REPORT IMMEDIATELY:    Following upper endoscopy (EGD)  Vomiting of blood or coffee ground material  New chest pain or pain under the shoulder blades  Painful or persistently difficult swallowing  New shortness of breath  Fever of 100F or higher  Black, tarry-looking stools  For urgent or emergent issues, a gastroenterologist can be reached at any hour by calling [phone]. Do not use MyChart messaging for urgent concerns.    DIET:  We do recommend a small meal at first, but then you may proceed to your regular diet.  Drink plenty of fluids but you should avoid alcoholic beverages for 24 hours.  ACTIVITY:  You should plan to take it easy for the rest of today and you should NOT DRIVE or use heavy machinery  until tomorrow (because of the sedation medicines used during the test).    FOLLOW UP: Our staff will call the number listed on your records 48-72 hours following your procedure to check on you and address any questions or concerns that you may have regarding the information given to you following your procedure. If we do not reach you, we will leave a message.  We will attempt to reach you two times.  During this call, we will ask if you have developed any symptoms of COVID 19. If you develop any symptoms (ie: fever, flu-like symptoms, shortness of breath, cough etc.) before then, please call [phone].  If you test positive for Covid 19 in the 2 weeks post procedure, please call and report this information to us.    If any biopsies were taken you will be contacted by phone or by letter within the next 1-3 weeks.  Please call us at [phone] if you have not heard about the biopsies in 3 weeks.    SIGNATURES/CONFIDENTIALITY: You and/or your care partner have signed paperwork which will be entered into your electronic medical record.  These signatures attest to the fact that that the information above on your After Visit Summary has been reviewed and is understood.  Full responsibility of the confidentiality of this discharge information lies with you and/or your care-partner. 

## 2021-05-27 NOTE — Progress Notes (Signed)
VS completed by HF. ? ? ?Medical history reviewed and updated. ? ?

## 2021-05-29 DIAGNOSIS — I10 Essential (primary) hypertension: Secondary | ICD-10-CM | POA: Diagnosis not present

## 2021-05-29 DIAGNOSIS — E559 Vitamin D deficiency, unspecified: Secondary | ICD-10-CM | POA: Diagnosis not present

## 2021-05-29 DIAGNOSIS — L570 Actinic keratosis: Secondary | ICD-10-CM | POA: Diagnosis not present

## 2021-05-29 DIAGNOSIS — K219 Gastro-esophageal reflux disease without esophagitis: Secondary | ICD-10-CM | POA: Diagnosis not present

## 2021-05-29 DIAGNOSIS — R1310 Dysphagia, unspecified: Secondary | ICD-10-CM | POA: Diagnosis not present

## 2021-05-29 DIAGNOSIS — H3530 Unspecified macular degeneration: Secondary | ICD-10-CM | POA: Diagnosis not present

## 2021-05-29 DIAGNOSIS — E43 Unspecified severe protein-calorie malnutrition: Secondary | ICD-10-CM | POA: Diagnosis not present

## 2021-05-29 DIAGNOSIS — Z85828 Personal history of other malignant neoplasm of skin: Secondary | ICD-10-CM | POA: Diagnosis not present

## 2021-05-29 DIAGNOSIS — C159 Malignant neoplasm of esophagus, unspecified: Secondary | ICD-10-CM | POA: Diagnosis not present

## 2021-05-29 DIAGNOSIS — E039 Hypothyroidism, unspecified: Secondary | ICD-10-CM | POA: Diagnosis not present

## 2021-05-29 DIAGNOSIS — Z8619 Personal history of other infectious and parasitic diseases: Secondary | ICD-10-CM | POA: Diagnosis not present

## 2021-05-29 DIAGNOSIS — K222 Esophageal obstruction: Secondary | ICD-10-CM | POA: Diagnosis not present

## 2021-05-29 DIAGNOSIS — C7989 Secondary malignant neoplasm of other specified sites: Secondary | ICD-10-CM | POA: Diagnosis not present

## 2021-05-31 ENCOUNTER — Telehealth: Payer: Self-pay

## 2021-05-31 DIAGNOSIS — K222 Esophageal obstruction: Secondary | ICD-10-CM | POA: Diagnosis not present

## 2021-05-31 DIAGNOSIS — C159 Malignant neoplasm of esophagus, unspecified: Secondary | ICD-10-CM | POA: Diagnosis not present

## 2021-05-31 DIAGNOSIS — I10 Essential (primary) hypertension: Secondary | ICD-10-CM | POA: Diagnosis not present

## 2021-05-31 DIAGNOSIS — E43 Unspecified severe protein-calorie malnutrition: Secondary | ICD-10-CM | POA: Diagnosis not present

## 2021-05-31 DIAGNOSIS — R1310 Dysphagia, unspecified: Secondary | ICD-10-CM | POA: Diagnosis not present

## 2021-05-31 DIAGNOSIS — C7989 Secondary malignant neoplasm of other specified sites: Secondary | ICD-10-CM | POA: Diagnosis not present

## 2021-05-31 NOTE — Telephone Encounter (Signed)
?  Follow up Call- ? ? ?  05/27/2021  ? 10:17 AM 04/19/2021  ? 10:29 AM 03/23/2021  ? 10:08 AM 02/24/2021  ?  4:02 PM 01/29/2021  ?  1:10 PM 01/20/2021  ? 11:02 AM 08/19/2020  ? 10:41 AM  ?Call back number  ?Post procedure Call Back phone  # [phone]  ?Permission to leave phone message Yes Yes Yes Yes Yes Yes Yes  ?  ? ?Patient questions: ? ?Do you have a fever, pain , or abdominal swelling? No. ?Pain Score  0 * ? ?Have you tolerated food without any problems? Yes.   ? ?Have you been able to return to your normal activities? Yes.   ? ?Do you have any questions about your discharge instructions: ?Diet   No. ?Medications  No. ?Follow up visit  No. ? ?Do you have questions or concerns about your Care? No. ? ?Actions: ?* If pain score is 4 or above: ?No action needed, pain <4. ? ? ?

## 2021-06-07 DIAGNOSIS — H3530 Unspecified macular degeneration: Secondary | ICD-10-CM | POA: Diagnosis not present

## 2021-06-07 DIAGNOSIS — K219 Gastro-esophageal reflux disease without esophagitis: Secondary | ICD-10-CM | POA: Diagnosis not present

## 2021-06-07 DIAGNOSIS — E559 Vitamin D deficiency, unspecified: Secondary | ICD-10-CM | POA: Diagnosis not present

## 2021-06-07 DIAGNOSIS — L570 Actinic keratosis: Secondary | ICD-10-CM | POA: Diagnosis not present

## 2021-06-07 DIAGNOSIS — C159 Malignant neoplasm of esophagus, unspecified: Secondary | ICD-10-CM | POA: Diagnosis not present

## 2021-06-07 DIAGNOSIS — C7989 Secondary malignant neoplasm of other specified sites: Secondary | ICD-10-CM | POA: Diagnosis not present

## 2021-06-07 DIAGNOSIS — E039 Hypothyroidism, unspecified: Secondary | ICD-10-CM | POA: Diagnosis not present

## 2021-06-07 DIAGNOSIS — R1310 Dysphagia, unspecified: Secondary | ICD-10-CM | POA: Diagnosis not present

## 2021-06-07 DIAGNOSIS — Z8619 Personal history of other infectious and parasitic diseases: Secondary | ICD-10-CM | POA: Diagnosis not present

## 2021-06-07 DIAGNOSIS — I10 Essential (primary) hypertension: Secondary | ICD-10-CM | POA: Diagnosis not present

## 2021-06-07 DIAGNOSIS — E43 Unspecified severe protein-calorie malnutrition: Secondary | ICD-10-CM | POA: Diagnosis not present

## 2021-06-07 DIAGNOSIS — K222 Esophageal obstruction: Secondary | ICD-10-CM | POA: Diagnosis not present

## 2021-06-07 DIAGNOSIS — Z85828 Personal history of other malignant neoplasm of skin: Secondary | ICD-10-CM | POA: Diagnosis not present

## 2021-06-22 ENCOUNTER — Encounter: Payer: Medicare Other | Admitting: Internal Medicine

## 2021-06-24 DIAGNOSIS — R1310 Dysphagia, unspecified: Secondary | ICD-10-CM | POA: Diagnosis not present

## 2021-06-24 DIAGNOSIS — E43 Unspecified severe protein-calorie malnutrition: Secondary | ICD-10-CM | POA: Diagnosis not present

## 2021-06-24 DIAGNOSIS — I10 Essential (primary) hypertension: Secondary | ICD-10-CM | POA: Diagnosis not present

## 2021-06-24 DIAGNOSIS — C7989 Secondary malignant neoplasm of other specified sites: Secondary | ICD-10-CM | POA: Diagnosis not present

## 2021-06-24 DIAGNOSIS — K222 Esophageal obstruction: Secondary | ICD-10-CM | POA: Diagnosis not present

## 2021-06-24 DIAGNOSIS — C159 Malignant neoplasm of esophagus, unspecified: Secondary | ICD-10-CM | POA: Diagnosis not present

## 2021-06-28 ENCOUNTER — Telehealth: Payer: Self-pay

## 2021-06-28 NOTE — Telephone Encounter (Signed)
Received Notice of Hospice Revocation from [facility] Date of discharge 06/24/2021.

## 2021-07-13 ENCOUNTER — Encounter: Payer: Self-pay | Admitting: Internal Medicine

## 2021-07-14 ENCOUNTER — Non-Acute Institutional Stay: Payer: Medicare Other | Admitting: Internal Medicine

## 2021-07-14 ENCOUNTER — Encounter: Payer: Self-pay | Admitting: Internal Medicine

## 2021-07-14 VITALS — HR 97 | Temp 97.5°F | Ht 63.0 in | Wt 95.1 lb

## 2021-07-14 DIAGNOSIS — R000 Tachycardia, unspecified: Secondary | ICD-10-CM | POA: Diagnosis not present

## 2021-07-14 DIAGNOSIS — Z66 Do not resuscitate: Secondary | ICD-10-CM | POA: Diagnosis not present

## 2021-07-14 DIAGNOSIS — E039 Hypothyroidism, unspecified: Secondary | ICD-10-CM | POA: Diagnosis not present

## 2021-07-14 DIAGNOSIS — R5383 Other fatigue: Secondary | ICD-10-CM

## 2021-07-14 DIAGNOSIS — R531 Weakness: Secondary | ICD-10-CM

## 2021-07-14 DIAGNOSIS — C155 Malignant neoplasm of lower third of esophagus: Secondary | ICD-10-CM

## 2021-07-15 NOTE — Progress Notes (Signed)
Location: [facility] of Service:  Clinic (12)  Provider:   Code Status: DNR Goals of Care:     07/14/2021   10:02 AM  Advanced Directives  Does Patient Have a Medical Advance Directive? Yes  Type of Paramedic of [place];Living will;Out of facility DNR (pink MOST or yellow form)  Does patient want to make changes to medical advance directive? No - Patient declined  Copy of [place] in Chart? Yes - validated most recent copy scanned in chart (See row information)  Pre-existing out of facility DNR order (yellow form or pink MOST form) Pink MOST form placed in chart (order not valid for inpatient use)     Chief Complaint  Patient presents with   Medical Management of Chronic Issues    Patient presents today for a 6 month follow up with labs. Patient is present with daughter, [name]    HPI: Patient is a 88 y.o. female seen today for an acute visit for Discussing Goals of Care  Patient has h/o HTN and Hypothyroidism  Diagnosed with Esophageal cancer in 07/22 Underwent Palliative Radiation And Recurent dilatation for Stricture  But now she is end stage and no more procedures Patient stayed mostly quiet and would not answer  Per daughter she has been like this for past few weeks  Sleeps most of the time. Able to do her ADLS in her apartment but very weak  Not eating much Has lost some weight Has discomfort in her chest but no pain Hospice was involved but she did not like them so they are not coming anymore Daughter helping some Wants MOST form signed which says Comfort care Wants [place] to help with her Meds      Past Medical History:  Diagnosis Date   Actinic keratosis    Aftercare following surgery of the skin or subcutaneous tissue    Basal cell carcinoma of skin of other parts of face    Bone/cartilage disorder    nose   Esophagus cancer ([place])    Hypertension    Neoplasm of uncertain  behavior of skin    Screening for malignant neoplasm of the rectum    Thyroid disease     Past Surgical History:  Procedure Laterality Date   APPENDECTOMY  1939   Dr. Griffin [facility]   BACK SURGERY  2005   Dr. Micheal Likens   BALLOON DILATION N/A 01/05/2021   Procedure: Larrie Kass DILATION;  Surgeon: Irene Shipper, MD;  Location: [facility];  Service: Endoscopy;  Laterality: N/A;   Cancer Removal Forehead, Basal Cell N/A 11/24/2009   colonoscopy performation  2000   Dr. Maurene Capes, Dr.Weatherly   ESOPHAGOGASTRODUODENOSCOPY (EGD) WITH PROPOFOL N/A 06/06/2018   Procedure: ESOPHAGOGASTRODUODENOSCOPY (EGD) WITH PROPOFOL;  Surgeon: Milus Banister, MD;  [facility];  Service: Endoscopy;  Laterality: N/A;   ESOPHAGOGASTRODUODENOSCOPY (EGD) WITH PROPOFOL N/A 01/05/2021   Procedure: ESOPHAGOGASTRODUODENOSCOPY (EGD) WITH PROPOFOL;  Surgeon: Irene Shipper, MD;  [facility];  Service: Endoscopy;  Laterality: N/A;   FOREIGN BODY REMOVAL  06/06/2018   Procedure: FOREIGN BODY REMOVAL;  Surgeon: Milus Banister, MD;  [facility];  Service: Endoscopy;;   SAVORY DILATION N/A 01/05/2021   Procedure: Azzie Almas DILATION;  Surgeon: Irene Shipper, MD;  [facility];  Service: Endoscopy;  Laterality: N/A;   UPPER GASTROINTESTINAL ENDOSCOPY      Allergies  Allergen Reactions   Polysporin [Bacitracin-Polymyxin B] Anaphylaxis and Rash  Sulfonamide Derivatives Anaphylaxis and Rash    REACTION: hive    Outpatient Encounter Medications as of 07/14/2021  Medication Sig   levothyroxine (SYNTHROID) 50 MCG tablet TAKE 1 TABLET BY MOUTH ONCE DAILY BEFORE BREAKFAST   Polyethyl Glycol-Propyl Glycol (SYSTANE) 0.4-0.3 % SOLN Place 1 drop into both eyes daily as needed (Dry eyes).   No facility-administered encounter medications on file as of 07/14/2021.    Review of Systems:  Review of Systems  Constitutional:  Positive for activity change, appetite change and unexpected weight  change.  HENT:  Positive for trouble swallowing.   Respiratory:  Negative for cough and shortness of breath.   Cardiovascular:  Negative for leg swelling.  Gastrointestinal:  Negative for constipation.  Genitourinary: Negative.   Musculoskeletal:  Negative for arthralgias, gait problem and myalgias.  Skin: Negative.   Neurological:  Positive for weakness. Negative for dizziness.  Psychiatric/Behavioral:  Positive for dysphoric mood. Negative for confusion and sleep disturbance.     Health Maintenance  Topic Date Due   DEXA SCAN  01/11/2022 (Originally 05/21/1993)   INFLUENZA VACCINE  09/07/2021   TETANUS/TDAP  10/08/2028   Pneumonia Vaccine 80+ Years old  Completed   COVID-19 Vaccine  Completed   Zoster Vaccines- Shingrix  Completed   HPV VACCINES  Aged Out    Physical Exam: Vitals:   07/14/21 0959  Pulse: 97  Temp: (!) 97.5 F (36.4 C)  SpO2: 97%  Weight: 95 lb 1.6 oz (43.1 kg)  Height: '5\' 3"'$  (1.6 m)   Body mass index is 16.85 kg/m. Physical Exam Vitals reviewed.  Constitutional:      Comments: Very frail  HENT:     Head: Normocephalic.     Nose: Nose normal.     Mouth/Throat:     Mouth: Mucous membranes are moist.     Pharynx: Oropharynx is clear.  Eyes:     Pupils: Pupils are equal, round, and reactive to light.  Cardiovascular:     Rate and Rhythm: Regular rhythm. Tachycardia present.     Pulses: Normal pulses.     Heart sounds: Normal heart sounds. No murmur heard. Pulmonary:     Effort: Pulmonary effort is normal.     Breath sounds: Normal breath sounds.  Abdominal:     General: Abdomen is flat. Bowel sounds are normal.     Palpations: Abdomen is soft.  Musculoskeletal:        General: No swelling.     Cervical back: Neck supple.  Skin:    General: Skin is warm.  Neurological:     General: No focal deficit present.     Mental Status: She is alert and oriented to person, place, and time.  Psychiatric:        Mood and Affect: Mood normal.         Thought Content: Thought content normal.    Labs reviewed: Basic Metabolic Panel: Recent Labs    08/26/20 1043 09/18/20 1018 12/17/20 1335  NA 142 138 140  K 5.9* 5.2* 4.1  CL 98 100 102  CO2 '29 28 29  '$ GLUCOSE 114* 135* 95  BUN 15 20 27*  CREATININE 0.94 0.86 0.86  CALCIUM 11.6* 10.9* 10.4*  MG 2.4  --   --    Liver Function Tests: Recent Labs    08/26/20 1043 09/18/20 1018 12/17/20 1335  AST '24 15 22  '$ ALT '17 16 17  '$ ALKPHOS 104 110 121  BILITOT 0.7 0.5 0.4  PROT 7.7 7.5 7.2  ALBUMIN 4.1 3.5 3.9   No results for input(s): "LIPASE", "AMYLASE" in the last 8760 hours. No results for input(s): "AMMONIA" in the last 8760 hours. CBC: Recent Labs    08/26/20 1043 09/18/20 1018 12/17/20 1335  WBC 11.2* 11.7* 6.4  NEUTROABS 7.2 7.5 3.9  HGB 14.5 13.9 14.9  HCT 44.5 42.5 44.6  MCV 89.4 87.8 86.1  PLT 324 436* 283   Lipid Panel: No results for input(s): "CHOL", "HDL", "LDLCALC", "TRIG", "CHOLHDL", "LDLDIRECT" in the last 8760 hours. No results found for: "HGBA1C"  Procedures since last visit: No results found.  Assessment/Plan 1. DNR (do not resuscitate) MOST form discussed and Signed for Comfort Care  2. Malignant neoplasm of lower third of esophagus (HCC) Not in any pain  Hospice is not involved yet as she did not like [facility] They are thinking of [facility] but want to wait I have told her daughter that I can help her with End of life care with Meds  if needed to reach out to [place] but will need more Nursing care soon She also does not want to come to SNF right now   3. Lethargy with weakness and tachycardia Most likely due to Dehydration and  anemia  4  Acquired hypothyroidism Continues to take her Synthroid     Labs/tests ordered:  * No order type specified * Next appt:  Visit date not found

## 2021-07-20 ENCOUNTER — Other Ambulatory Visit: Payer: Self-pay | Admitting: Orthopedic Surgery

## 2021-07-20 DIAGNOSIS — Z515 Encounter for palliative care: Secondary | ICD-10-CM

## 2021-07-20 MED ORDER — MORPHINE SULFATE (CONCENTRATE) 20 MG/ML PO SOLN: 5.0000 mg | ORAL | 0 refills | Status: DC | PRN

## 2021-07-20 MED ORDER — LORAZEPAM 2 MG/ML PO CONC: 0.6000 mg | ORAL | 0 refills | Status: DC | PRN

## 2021-07-20 MED ORDER — LORAZEPAM 2 MG/ML PO CONC: 1.0000 mg | ORAL | 0 refills | Status: DC | PRN

## 2021-07-21 ENCOUNTER — Non-Acute Institutional Stay (SKILLED_NURSING_FACILITY): Payer: Medicare Other | Admitting: Orthopedic Surgery

## 2021-07-21 ENCOUNTER — Encounter: Payer: Self-pay | Admitting: Orthopedic Surgery

## 2021-07-21 DIAGNOSIS — Z515 Encounter for palliative care: Secondary | ICD-10-CM | POA: Diagnosis not present

## 2021-07-21 DIAGNOSIS — C155 Malignant neoplasm of lower third of esophagus: Secondary | ICD-10-CM | POA: Diagnosis not present

## 2021-07-21 MED ORDER — LORAZEPAM 2 MG/ML PO CONC: 0.6000 mg | ORAL | 0 refills | Status: DC | PRN

## 2021-07-21 NOTE — Progress Notes (Signed)
Location:  [facility] Room Number: 159/A Place of Service:  SNF [phone]) Provider: Yvonna Alanis, NP  Patient Care Team: Virgie Dad, MD as PCP - General (Internal Medicine) Katy Apo, MD as Consulting Physician (Ophthalmology) Rolm Bookbinder, MD as Consulting Physician (Dermatology) [name], MD as Consulting Physician (Oncology) Royston Bake, RN as Oncology Nurse Navigator (Oncology)  Extended Emergency Contact Information Primary Emergency Contact: [facility] Address: [address]          [place], [place] [address] Johnnette Litter of [place] Phone: [phone] Mobile Phone: [phone] Relation: Daughter Secondary Emergency Contact: Cone,Rene Address: [place], [place] [address] Johnnette Litter of [place] Phone: [phone] Mobile Phone: [phone] Relation: Daughter  Code Status:  DNR Goals of care: Advanced Directive information    07/21/2021    1:31 PM  Advanced Directives  Does Patient Have a Medical Advance Directive? Yes  Type of Paramedic of [place];Living will;Out of facility DNR (pink MOST or yellow form)  Does patient want to make changes to medical advance directive? No - Patient declined  Copy of [place] in Chart? Yes - validated most recent copy scanned in chart (See row information)  Pre-existing out of facility DNR order (yellow form or pink MOST form) Pink MOST form placed in chart (order not valid for inpatient use)     Chief Complaint  Patient presents with   Acute Visit    Anxiety     HPI:  Pt is a 88 y.o. female seen today for an acute visit due to anxiety.   06/13 she was admitted to the rehab unit at [facility]. PMH: dysphagia, esophageal stricture, esophageal cancer, hypothyroidism, macular degeneration, and anorexia.   08/2020 she was diagnosed with esophageal cancer. She underwent palliative radiation and  dilation for stricture in past. Her health has continued to decline, she is now end stage. She was enrolled in Hospice in the past, but did not like them and had stopped visits. Goals of care are comfort based. MOST form signed. Daughters moved her to rehab unit yesterday. Reports she was alert and very specific about having her medication scheduled. She reported to daughters that she plans to go with 2 days. She was started on roxanol 5 mg po Q4hrs prn and ativan '1mg'$  po Q4hrs prn yesterday. Daughters concerned ativan maybe to much.   She was easily aroused with me today. She c/o chest discomfort, but denied pain or anxiety during our encounter. She fell asleep towards the end of my exam.   Appetite remains poor. Not drinking well. Still using walker to go to bathroom.    Past Medical History:  Diagnosis Date   Actinic keratosis    Aftercare following surgery of the skin or subcutaneous tissue    Basal cell carcinoma of skin of other parts of face    Bone/cartilage disorder    nose   Esophagus cancer ([place])    Hypertension    Neoplasm of uncertain behavior of skin    Screening for malignant neoplasm of the rectum    Thyroid disease    Past Surgical History:  Procedure Laterality Date   APPENDECTOMY  1939   Dr. Griffin [facility]   BACK SURGERY  2005   Dr. Micheal Likens   BALLOON DILATION N/A 01/05/2021   Procedure: Larrie Kass DILATION;  Surgeon: Irene Shipper, MD;  [facility];  Service: Endoscopy;  Laterality: N/A;   Cancer Removal Forehead, Basal Cell N/A 11/24/2009   colonoscopy performation  2000   Dr. Maurene Capes, Dr.Weatherly   ESOPHAGOGASTRODUODENOSCOPY (EGD) WITH PROPOFOL N/A 06/06/2018   Procedure: ESOPHAGOGASTRODUODENOSCOPY (EGD) WITH PROPOFOL;  Surgeon: Milus Banister, MD;  [facility];  Service: Endoscopy;  Laterality: N/A;   ESOPHAGOGASTRODUODENOSCOPY (EGD) WITH PROPOFOL N/A 01/05/2021   Procedure: ESOPHAGOGASTRODUODENOSCOPY (EGD) WITH PROPOFOL;  Surgeon:  Irene Shipper, MD;  [facility];  Service: Endoscopy;  Laterality: N/A;   FOREIGN BODY REMOVAL  06/06/2018   Procedure: FOREIGN BODY REMOVAL;  Surgeon: Milus Banister, MD;  [facility];  Service: Endoscopy;;   SAVORY DILATION N/A 01/05/2021   Procedure: Azzie Almas DILATION;  Surgeon: Irene Shipper, MD;  [facility];  Service: Endoscopy;  Laterality: N/A;   UPPER GASTROINTESTINAL ENDOSCOPY      Allergies  Allergen Reactions   Polysporin [Bacitracin-Polymyxin B] Anaphylaxis and Rash   Sulfonamide Derivatives Anaphylaxis and Rash    REACTION: hive    Outpatient Encounter Medications as of 07/21/2021  Medication Sig   LORazepam (ATIVAN) 2 MG/ML concentrated solution Take 0.5 mLs (1 mg total) by mouth every 4 (four) hours as needed for anxiety.   morphine (ROXANOL) 20 MG/ML concentrated solution Take 0.25 mLs (5 mg total) by mouth every 4 (four) hours as needed for severe pain, breakthrough pain or shortness of breath.   [DISCONTINUED] levothyroxine (SYNTHROID) 50 MCG tablet TAKE 1 TABLET BY MOUTH ONCE DAILY BEFORE BREAKFAST   [DISCONTINUED] Polyethyl Glycol-Propyl Glycol (SYSTANE) 0.4-0.3 % SOLN Place 1 drop into both eyes daily as needed (Dry eyes).   No facility-administered encounter medications on file as of 07/21/2021.    Review of Systems  Constitutional:  Negative for activity change, appetite change, chills, fatigue and fever.  HENT:  Positive for trouble swallowing. Negative for congestion.   Respiratory:  Negative for cough, shortness of breath and wheezing.   Cardiovascular:  Positive for chest pain. Negative for leg swelling.       "Chest discomfort"  Gastrointestinal:  Negative for abdominal distention, abdominal pain, constipation, diarrhea, nausea and vomiting.  Genitourinary:  Negative for dysuria, frequency and hematuria.  Musculoskeletal:  Positive for gait problem.  Skin:  Negative for wound.  Neurological:  Positive for weakness.  Negative for dizziness and headaches.  Psychiatric/Behavioral:  Negative for confusion, dysphoric mood and sleep disturbance. The patient is nervous/anxious.     Immunization History  Administered Date(s) Administered   Fluad Quad(high Dose 65+) 10/09/2018   Influenza, High Dose Seasonal PF 12/04/2016, 11/17/2017, 12/06/2019   Influenza-Unspecified 11/20/2020   Moderna Covid-19 Vaccine Bivalent Booster 86yr & up 11/27/2020   Moderna SARS-COV2 Booster Vaccination 07/07/2020   Moderna Sars-Covid-2 Vaccination 02/19/2019, 03/19/2019, 12/24/2019   Pneumococcal Conjugate-13 12/04/2016   Pneumococcal Polysaccharide-23 12/04/2017   Tdap 10/09/2018   Zoster Recombinat (Shingrix) 07/16/2017, 12/11/2017   Pertinent  Health Maintenance Due  Topic Date Due   DEXA SCAN  01/11/2022 (Originally 05/21/1993)   INFLUENZA VACCINE  09/07/2021      07/08/2020   10:42 AM 09/18/2020   10:55 AM 09/22/2020   10:38 AM 01/05/2021    7:14 AM 07/14/2021   10:03 AM  Fall Risk  Falls in the past year? 0    0  Was there an injury with Fall?     0  Fall Risk Category Calculator     0  Fall Risk Category     Low  Patient Fall Risk Level  Low fall risk Low fall risk Low fall risk Low fall risk  Patient at Risk for Falls Due to     No Fall Risks  Fall risk Follow up     Falls evaluation completed   Functional Status Survey:    Vitals:   07/21/21 1327  BP: (!) 161/70  Pulse: 79  Temp: 98.8 F (37.1 C)  SpO2: 95%  Height: '5\' 3"'$  (1.6 m)   Body mass index is 16.85 kg/m. Physical Exam Vitals reviewed.  Constitutional:      General: She is not in acute distress.    Appearance: She is cachectic.  HENT:     Head: Normocephalic.  Eyes:     General:        Right eye: No discharge.        Left eye: No discharge.  Cardiovascular:     Rate and Rhythm: Normal rate and regular rhythm.     Pulses: Normal pulses.     Heart sounds: Normal heart sounds.  Pulmonary:     Effort: Pulmonary effort is normal. No  respiratory distress.     Breath sounds: Normal breath sounds. No wheezing.  Abdominal:     General: Bowel sounds are normal. There is no distension.     Palpations: Abdomen is soft.     Tenderness: There is no abdominal tenderness.  Musculoskeletal:     Cervical back: Neck supple.     Right lower leg: No edema.     Left lower leg: No edema.  Skin:    General: Skin is warm and dry.     Capillary Refill: Capillary refill takes less than 2 seconds.  Neurological:     General: No focal deficit present.     Mental Status: She is oriented to person, place, and time and easily aroused.     Motor: Weakness present.     Gait: Gait abnormal.     Comments: walker  Psychiatric:        Mood and Affect: Mood normal.        Behavior: Behavior normal.     Labs reviewed: Recent Labs    08/26/20 1043 09/18/20 1018 12/17/20 1335  NA 142 138 140  K 5.9* 5.2* 4.1  CL 98 100 102  CO2 '29 28 29  '$ GLUCOSE 114* 135* 95  BUN 15 20 27*  CREATININE 0.94 0.86 0.86  CALCIUM 11.6* 10.9* 10.4*  MG 2.4  --   --    Recent Labs    08/26/20 1043 09/18/20 1018 12/17/20 1335  AST '24 15 22  '$ ALT '17 16 17  '$ ALKPHOS 104 110 121  BILITOT 0.7 0.5 0.4  PROT 7.7 7.5 7.2  ALBUMIN 4.1 3.5 3.9   Recent Labs    08/26/20 1043 09/18/20 1018 12/17/20 1335  WBC 11.2* 11.7* 6.4  NEUTROABS 7.2 7.5 3.9  HGB 14.5 13.9 14.9  HCT 44.5 42.5 44.6  MCV 89.4 87.8 86.1  PLT 324 436* 283   Lab Results  Component Value Date   TSH 2.40 06/30/2020   No results found for: "HGBA1C" Lab Results  Component Value Date   CHOL 217 (A) 06/30/2020   HDL 65 06/30/2020   LDLCALC 127 06/30/2020   TRIG 126 06/30/2020    Significant Diagnostic Results in last 30 days:  No results found.  Assessment/Plan 1. End of life care - diagnosed with esophageal cancer 08/2020 - goals of care comfort based - MOST form signed - tried hospice and did not like  it - roxanol started by Dr. Lyndel Safe 06/13 - will reduce Ativan to 0.5  mg- due to daughters concerns and she is still ambulating to bathroom  - LORazepam (ATIVAN) 2 MG/ML concentrated solution; Take 0.3 mLs (0.6 mg total) by mouth every 4 (four) hours as needed for anxiety.  Dispense: 30 mL; Refill: 0  2. Malignant neoplasm of lower third of esophagus (HCC) - end stages of disease - see above - s/p radiation and dilation due to stricture     Family/ staff Communication: plan discussed with patient, daughter and nurse  Labs/tests ordered:  none

## 2021-07-26 ENCOUNTER — Non-Acute Institutional Stay (SKILLED_NURSING_FACILITY): Payer: Medicare Other | Admitting: Internal Medicine

## 2021-07-26 DIAGNOSIS — Z515 Encounter for palliative care: Secondary | ICD-10-CM

## 2021-07-26 DIAGNOSIS — R5383 Other fatigue: Secondary | ICD-10-CM | POA: Diagnosis not present

## 2021-07-26 DIAGNOSIS — R531 Weakness: Secondary | ICD-10-CM | POA: Diagnosis not present

## 2021-07-26 DIAGNOSIS — C155 Malignant neoplasm of lower third of esophagus: Secondary | ICD-10-CM

## 2021-07-26 DIAGNOSIS — R1319 Other dysphagia: Secondary | ICD-10-CM

## 2021-07-27 ENCOUNTER — Encounter: Payer: Self-pay | Admitting: Internal Medicine

## 2021-07-27 NOTE — Progress Notes (Signed)
Provider:   Location:  Occupational psychologist of Service:  SNF (31)  PCP: Virgie Dad, MD Patient Care Team: Virgie Dad, MD as PCP - General (Internal Medicine) Katy Apo, MD as Consulting Physician (Ophthalmology) Rolm Bookbinder, MD as Consulting Physician (Dermatology) [name], MD as Consulting Physician (Oncology) Royston Bake, RN as Oncology Nurse Navigator (Oncology)  Extended Emergency Contact Information Primary Emergency Contact: [facility] Address: [address]          [place], [place] [address] Johnnette Litter of [place] Phone: [phone] Mobile Phone: [phone] Relation: Daughter Secondary Emergency Contact: Cone,Rene Address: [place], [place] [address] Johnnette Litter of [place] Phone: [phone] Mobile Phone: [phone] Relation: Daughter  Code Status: DNR Comfort Care Goals of Care: Advanced Directive information    07/21/2021    1:31 PM  Advanced Directives  Does Patient Have a Medical Advance Directive? Yes  Type of Paramedic of [place];Living will;Out of facility DNR (pink MOST or yellow form)  Does patient want to make changes to medical advance directive? No - Patient declined  Copy of [place] in Chart? Yes - validated most recent copy scanned in chart (See row information)  Pre-existing out of facility DNR order (yellow form or pink MOST form) Pink MOST form placed in chart (order not valid for inpatient use)      Chief Complaint  Patient presents with   New Admit To SNF    HPI: Patient is a 88 y.o. female seen today for admission to End of life care  Patient has h/o HTN and Hypothyroidism   Diagnosed with Esophageal cancer in 07/22 Underwent Palliative Radiation And Recurent dilatation for Stricture   But now she is end stage and no more procedures  Admitted in [place] for End of life care Daughter in room No  Hospice per patients request On Morphine and Ativan PRN Per daughter Roxanol and ativan are making her very sleepy so she does not want it scheduled Staff is doing good job using PRN Patients s continue s to be comfortable Not eating Just Ice chips More confused and Lethargy  Past Medical History:  Diagnosis Date   Actinic keratosis    Aftercare following surgery of the skin or subcutaneous tissue    Basal cell carcinoma of skin of other parts of face    Bone/cartilage disorder    nose   Esophagus cancer ([place])    Hypertension    Neoplasm of uncertain behavior of skin    Screening for malignant neoplasm of the rectum    Thyroid disease    Past Surgical History:  Procedure Laterality Date   APPENDECTOMY  1939   Dr. Griffin [facility]   BACK SURGERY  2005   Dr. Micheal Likens   BALLOON DILATION N/A 01/05/2021   Procedure: Larrie Kass DILATION;  Surgeon: Irene Shipper, MD;  Location: [facility];  Service: Endoscopy;  Laterality: N/A;   Cancer Removal Forehead, Basal Cell N/A 11/24/2009   colonoscopy performation  2000   Dr. Maurene Capes, Dr.Weatherly   ESOPHAGOGASTRODUODENOSCOPY (EGD) WITH PROPOFOL N/A 06/06/2018   Procedure: ESOPHAGOGASTRODUODENOSCOPY (EGD) WITH PROPOFOL;  Surgeon: Milus Banister, MD;  Location: [facility];  Service: Endoscopy;  Laterality: N/A;   ESOPHAGOGASTRODUODENOSCOPY (EGD) WITH PROPOFOL N/A 01/05/2021   Procedure: ESOPHAGOGASTRODUODENOSCOPY (EGD) WITH PROPOFOL;  Surgeon: Irene Shipper, MD;  Location: [facility];  Service: Endoscopy;  Laterality: N/A;   FOREIGN BODY REMOVAL  06/06/2018   Procedure: FOREIGN BODY REMOVAL;  Surgeon: Milus Banister, MD;  Location: [facility];  Service: Endoscopy;;   SAVORY DILATION N/A 01/05/2021   Procedure: Azzie Almas DILATION;  Surgeon: Irene Shipper, MD;  Location: [facility];  Service: Endoscopy;  Laterality: N/A;   UPPER GASTROINTESTINAL ENDOSCOPY      reports that she has quit smoking. Her smoking use included cigarettes. She  has never used smokeless tobacco. She reports that she does not currently use alcohol. She reports that she does not use drugs. Social History   Socioeconomic History   Marital status: Widowed    Spouse name: Not on file   Number of children: 4   Years of education: Not on file   Highest education level: Not on file  Occupational History   Occupation: retired  Tobacco Use   Smoking status: Former    Types: Cigarettes   Smokeless tobacco: Never   Tobacco comments:    Quit in the early 1950s  Vaping Use   Vaping Use: Never used  Substance and Sexual Activity   Alcohol use: Not Currently   Drug use: No   Sexual activity: Never  Other Topics Concern   Not on file  Social History Narrative   Tobacco use, amount per day now: NONE   Past tobacco use, amount per day: MINIMAL   How many years did you use tobacco: 3 OR 4    Alcohol use (drinks per week): 3 OR 4   Diet: REGULAR   Do you drink/eat things with caffeine: YES   Marital status:  WIDOWED                                What year were you married? 1951   Do you live in a house, apartment, assisted living, condo, trailer, etc.? APT   Is it one or more stories? YES   How many persons live in your home? A LOT   Do you have pets in your home?( please list) NO   Current or past profession: WORKED [employer]   Do you exercise?     YES                             Type and how often? WALKING- CLASSES   Do you have a living will? YES   Do you have a DNR form?    YES                               If not, do you want to discuss one?   Do you have signed POA/HPOA forms?  YES                      If so, please bring to you appointment   Social Determinants of Health   Financial Resource Strain: Not on file  Food Insecurity: Not on file  Transportation Needs: Not on file  Physical Activity: Not on file  Stress: Not on file  Social Connections: Not on file  Intimate Partner Violence: Not on file    Functional Status Survey:     Family History  Problem Relation Age of Onset   Kidney disease Mother    Heart disease Father    Colon cancer  Neg Hx    Esophageal cancer Neg Hx    Rectal cancer Neg Hx    Stomach cancer Neg Hx     Health Maintenance  Topic Date Due   DEXA SCAN  01/11/2022 (Originally 05/21/1993)   INFLUENZA VACCINE  09/07/2021   TETANUS/TDAP  10/08/2028   Pneumonia Vaccine 54+ Years old  Completed   COVID-19 Vaccine  Completed   Zoster Vaccines- Shingrix  Completed   HPV VACCINES  Aged Out    Allergies  Allergen Reactions   Polysporin [Bacitracin-Polymyxin B] Anaphylaxis and Rash   Sulfonamide Derivatives Anaphylaxis and Rash    REACTION: hive    Outpatient Encounter Medications as of 07/26/2021  Medication Sig   LORazepam (ATIVAN) 2 MG/ML concentrated solution Take 0.3 mLs (0.6 mg total) by mouth every 4 (four) hours as needed for anxiety.   morphine (ROXANOL) 20 MG/ML concentrated solution Take 0.25 mLs (5 mg total) by mouth every 4 (four) hours as needed for severe pain, breakthrough pain or shortness of breath.   No facility-administered encounter medications on file as of 07/26/2021.    Review of Systems  Unable to perform ROS: Dementia    Vitals:   07/27/21 1036  BP: (!) 161/70  Pulse: 79  Temp: 98.8 F (37.1 C)   There is no height or weight on file to calculate BMI. Physical Exam Vitals reviewed.  Constitutional:      Comments: Sleepy Speech Slurred Confused  HENT:     Head: Normocephalic.     Nose: Nose normal.     Mouth/Throat:     Mouth: Mucous membranes are moist.     Pharynx: Oropharynx is clear.  Eyes:     Pupils: Pupils are equal, round, and reactive to light.  Cardiovascular:     Rate and Rhythm: Normal rate and regular rhythm.     Pulses: Normal pulses.     Heart sounds: Normal heart sounds. No murmur heard. Pulmonary:     Effort: Pulmonary effort is normal.     Breath sounds: Normal breath sounds.  Abdominal:     General: Abdomen is flat. Bowel  sounds are normal.     Palpations: Abdomen is soft.  Musculoskeletal:        General: No swelling.     Cervical back: Neck supple.  Skin:    General: Skin is warm.  Neurological:     General: No focal deficit present.  Psychiatric:        Mood and Affect: Mood normal.        Thought Content: Thought content normal.     Labs reviewed: Basic Metabolic Panel: Recent Labs    08/26/20 1043 09/18/20 1018 12/17/20 1335  NA 142 138 140  K 5.9* 5.2* 4.1  CL 98 100 102  CO2 '29 28 29  '$ GLUCOSE 114* 135* 95  BUN 15 20 27*  CREATININE 0.94 0.86 0.86  CALCIUM 11.6* 10.9* 10.4*  MG 2.4  --   --    Liver Function Tests: Recent Labs    08/26/20 1043 09/18/20 1018 12/17/20 1335  AST '24 15 22  '$ ALT '17 16 17  '$ ALKPHOS 104 110 121  BILITOT 0.7 0.5 0.4  PROT 7.7 7.5 7.2  ALBUMIN 4.1 3.5 3.9   No results for input(s): "LIPASE", "AMYLASE" in the last 8760 hours. No results for input(s): "AMMONIA" in the last 8760 hours. CBC: Recent Labs    08/26/20 1043 09/18/20 1018 12/17/20 1335  WBC 11.2* 11.7* 6.4  NEUTROABS 7.2 7.5  3.9  HGB 14.5 13.9 14.9  HCT 44.5 42.5 44.6  MCV 89.4 87.8 86.1  PLT 324 436* 283   Cardiac Enzymes: No results for input(s): "CKTOTAL", "CKMB", "CKMBINDEX", "TROPONINI" in the last 8760 hours. BNP: Invalid input(s): "POCBNP" No results found for: "HGBA1C" Lab Results  Component Value Date   TSH 2.40 06/30/2020   No results found for: "VITAMINB12" No results found for: "FOLATE" Lab Results  Component Value Date   FERRITIN 316 (H) 10/24/2007    Imaging and Procedures obtained prior to SNF admission: DG C-Arm 1-60 Min-No Report  Result Date: 01/05/2021 Fluoroscopy was utilized by the requesting physician.  No radiographic interpretation.    Assessment/Plan 1. Malignant neoplasm of lower third of esophagus (HCC) End of Life On Morphine and Ativan Will not schedule it yet per family request She is no pain or discomfort Just anxious  2. End  of life care No Hospice per patient request  3. DYSPHAGIA She is not eating anymore  4. Lethargy Help with her ADLS  5. Weakness Needing help with her ADLS    Family/ staff Communication:   Labs/tests ordered:

## 2021-08-07 DEATH — deceased

## 2021-10-26 IMAGING — MR MR LUMBAR SPINE W/O CM
4 of 5 series · 24 of 48 positions shown · non-contrast
Comparison: Lumbar spine radiographs 01/22/2020

CLINICAL DATA: Left leg pain for 3 months.

EXAM:
MRI LUMBAR SPINE WITHOUT CONTRAST
TECHNIQUE: Multiplanar, multisequence MR imaging of the lumbar spine was
performed. No intravenous contrast was administered.

[Series 3: T2 · sagittal · 4.0mm · 0.53mm/px · 7 of 16 slices shown (1 of 2)]
[im 1/16]
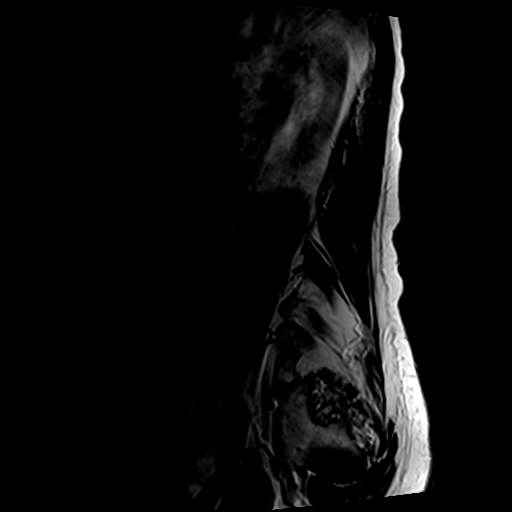
[im 3/16]
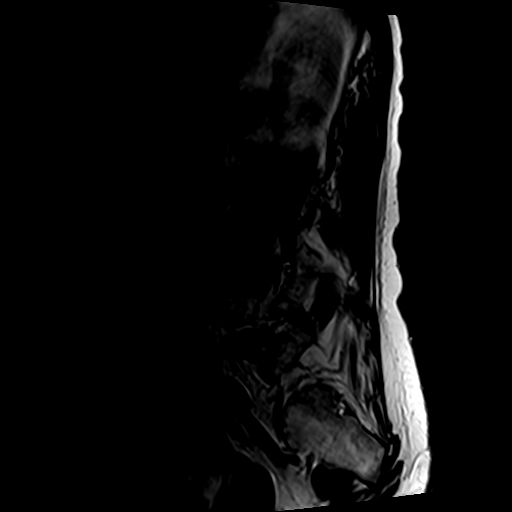
[im 6/16]
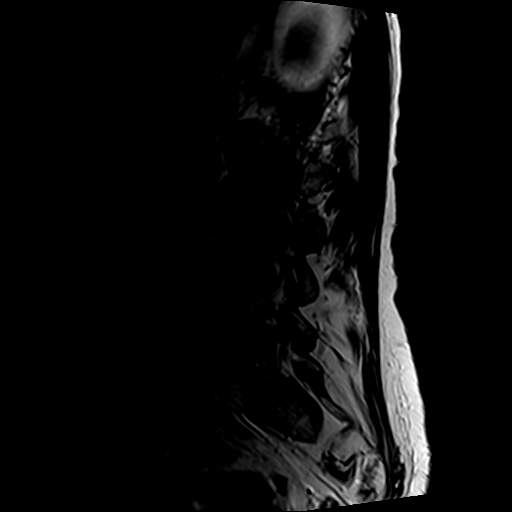
[im 8/16]
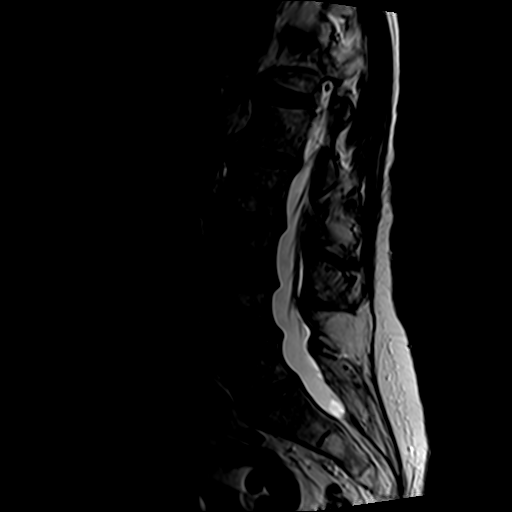
[im 11/16]
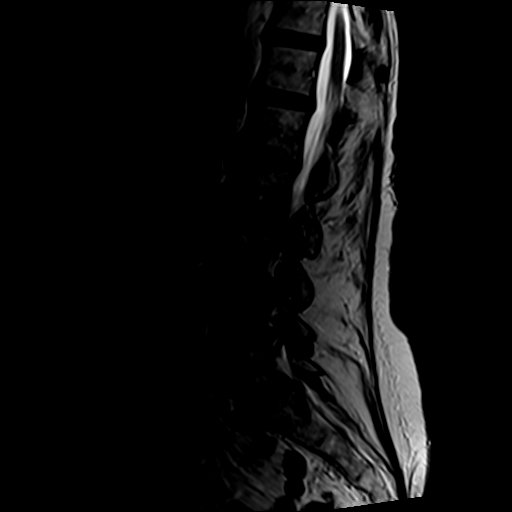
[im 13/16]
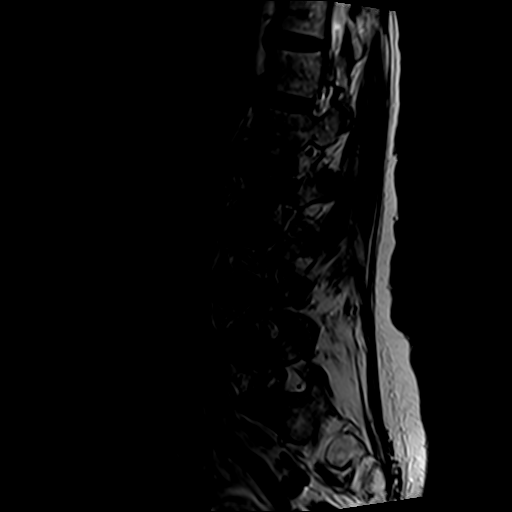
[im 16/16]
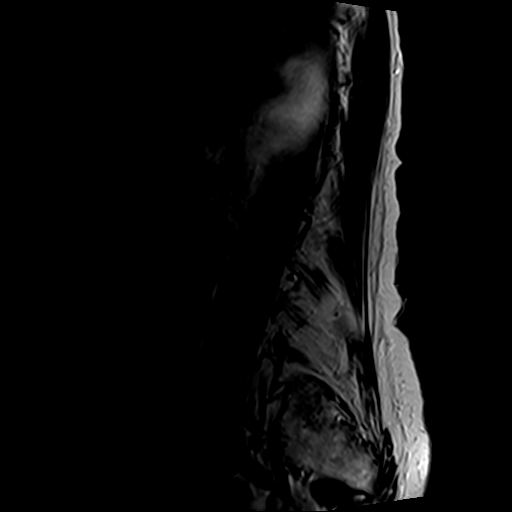

[Series 5: T1 · sagittal · 4.0mm · 0.53mm/px · 6 of 16 slices shown (1 of 2)]
[im 1/16]
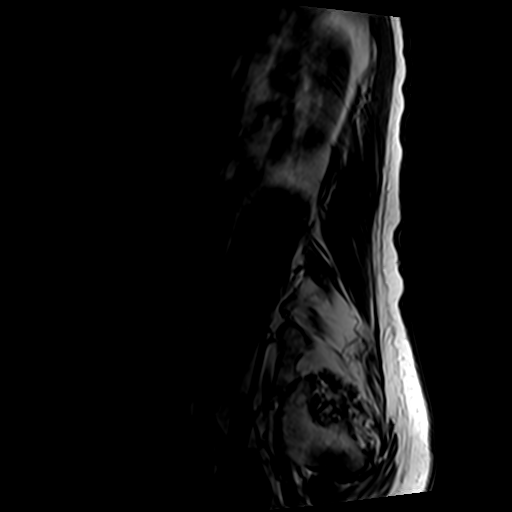
[im 4/16]
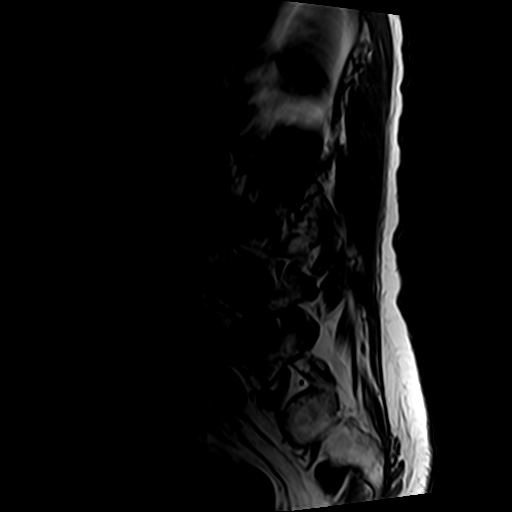
[im 7/16]
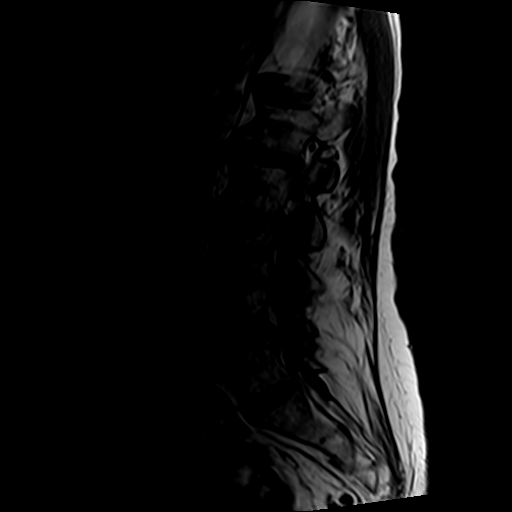
[im 10/16]
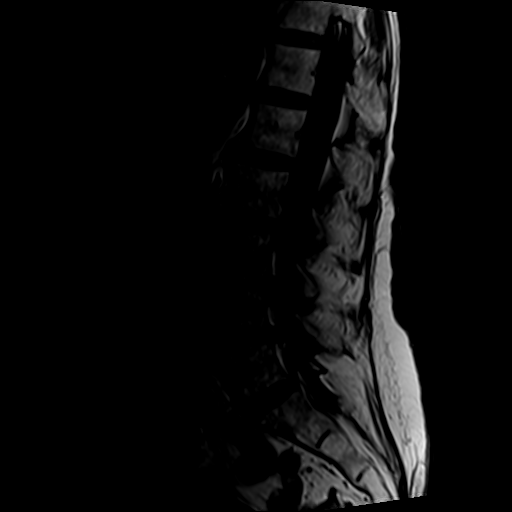
[im 13/16]
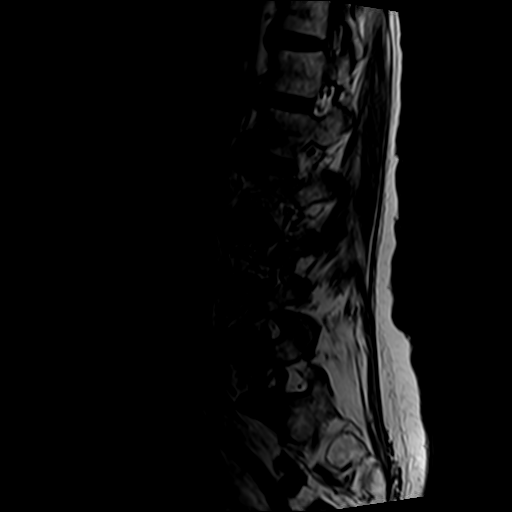
[im 16/16]
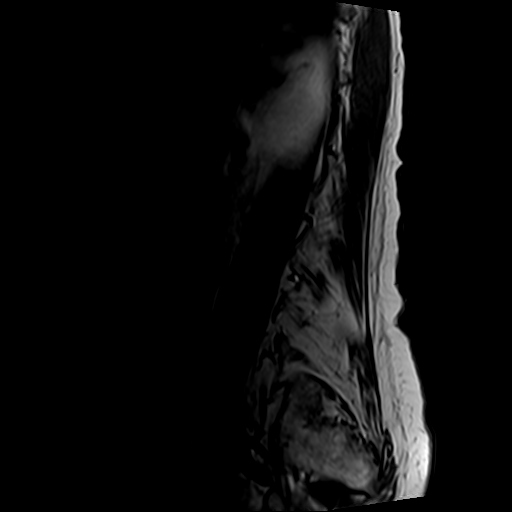

[Series 6: T2 · axial · 4.0mm · 0.70mm/px · z∈[-130,+71]mm · 8 of 36 slices shown (2 of 2)]
[im 1/36]
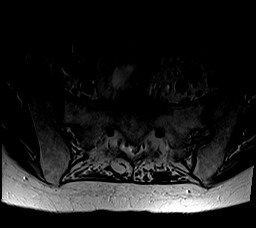
[im 6/36]
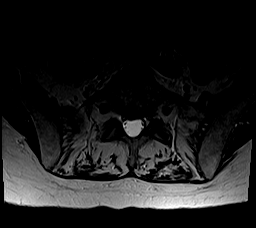
[im 11/36]
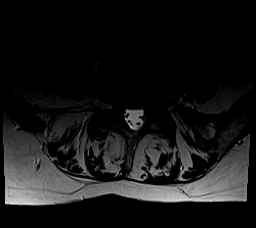
[im 17/36]
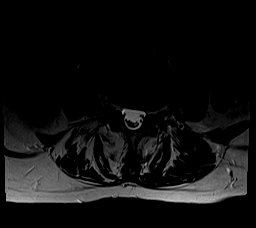
[im 19/36]
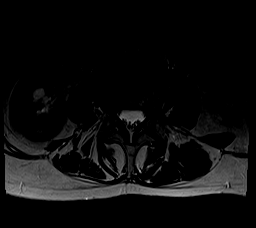
[im 25/36]
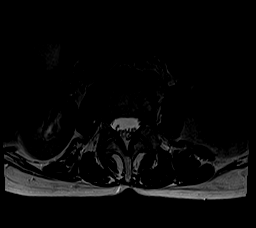
[im 30/36]
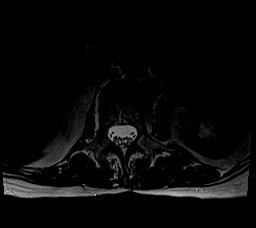
[im 36/36]
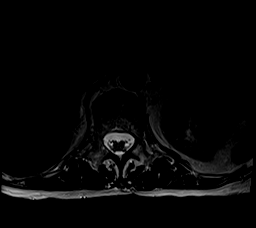

[Series 7: T1 · axial · 4.0mm · 0.35mm/px · z∈[-104,+40]mm · 3 of 36 slices shown (2 of 2)]
[im 6/36]
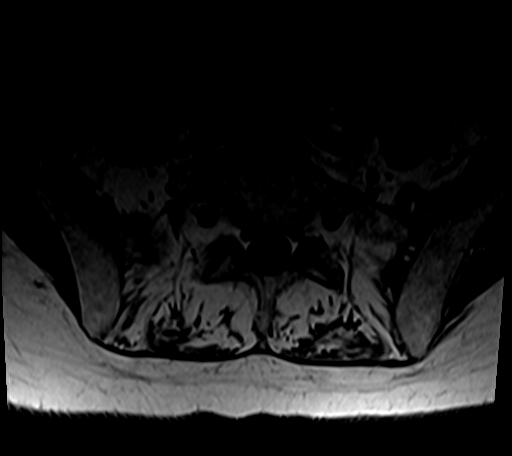
[im 19/36]
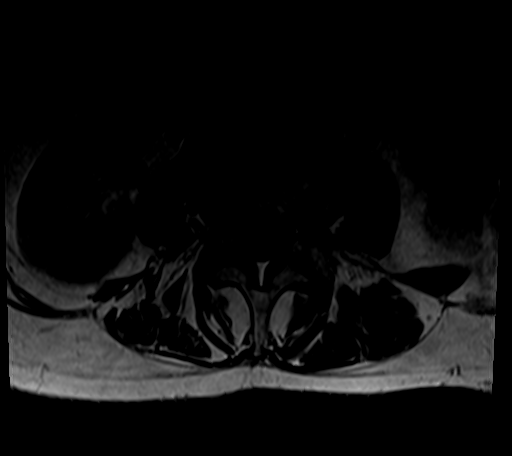
[im 30/36]
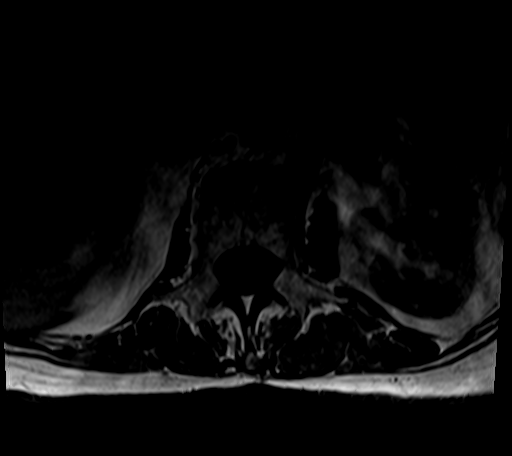

[24 of 48 positions shown; findings below may reference images not displayed]

FINDINGS: Segmentation: There is mildly transitional lumbosacral anatomy. The
transitional segment will be designated L5 demonstrating an enlarged
left transverse process articulating with the sacrum. There is a
fully formed L5-S1 disc, and there are absent ribs at T12.

Alignment: Slight lumbar levoscoliosis. Trace retrolisthesis of L3
on L4.

Vertebrae: No fracture or suspicious osseous lesion. Degenerative
endplate changes at L3-4 and L4-5 including mild edema.

Conus medullaris and cauda equina: Conus extends to the T12-L1
level. Conus and cauda equina appear normal.

Paraspinal and other soft tissues: Bilateral renal sinus cysts.
Small stone in the gallbladder.

Disc levels:

Disc desiccation throughout the lumbar spine with exception of
L5-S1. Severe disc space narrowing at L3-4 and L4-5.

T12-L1: Shallow right paracentral to right subarticular disc
protrusion without stenosis.

L1-2: Mild disc bulging and mild facet hypertrophy without stenosis.

L2-3: Disc bulging, a small left foraminal disc protrusion, and mild
facet hypertrophy result in mild bilateral lateral recess stenosis
without spinal or neural foraminal stenosis.

L3-4: Right eccentric disc bulging, endplate spurring, severe disc
space height loss, and moderate facet and ligamentum flavum
hypertrophy result in mild left greater than right lateral recess
stenosis and moderate right neural foraminal stenosis without
significant spinal stenosis.

L4-5: Prior left laminectomy. Circumferential disc bulging eccentric
to the left, endplate spurring, severe disc space height loss, and
moderate right and mild left facet hypertrophy result in mild right
and moderate to severe left neural foraminal stenosis. There is T1
and T2 hypointense material in the left lateral recess which has a
rounded appearance on axial images and is suspicious for a small
disc protrusion potentially superimposed on postoperative epidural
fibrosis. This results in left lateral recess stenosis and potential
left L5 nerve root impingement. No spinal stenosis.

L5-S1: Minimal disc bulging and mild facet arthrosis without
stenosis.
IMPRESSION: 1. Advanced disc degeneration at L3-4 and L4-5 with moderate to
severe left neural foraminal stenosis at L4-5.
2. Prior left laminectomy at L4-5 with possible left subarticular
disc protrusion versus epidural fibrosis. Correlate for L5
radiculopathy and consider postcontrast imaging for further
characterization.
